# Patient Record
Sex: Female | Born: 1985
Health system: Southern US, Community
[De-identification: ages and names within clinical notes are randomized; demographics above are authoritative.]

## PROBLEM LIST (undated history)

## (undated) ENCOUNTER — Inpatient Hospital Stay (HOSPITAL_COMMUNITY): Payer: Self-pay

## (undated) DIAGNOSIS — B342 Coronavirus infection, unspecified: Secondary | ICD-10-CM

## (undated) DIAGNOSIS — S065XAA Traumatic subdural hemorrhage with loss of consciousness status unknown, initial encounter: Secondary | ICD-10-CM

## (undated) DIAGNOSIS — I82409 Acute embolism and thrombosis of unspecified deep veins of unspecified lower extremity: Secondary | ICD-10-CM

## (undated) DIAGNOSIS — O24419 Gestational diabetes mellitus in pregnancy, unspecified control: Secondary | ICD-10-CM

## (undated) DIAGNOSIS — I10 Essential (primary) hypertension: Secondary | ICD-10-CM

## (undated) DIAGNOSIS — O039 Complete or unspecified spontaneous abortion without complication: Secondary | ICD-10-CM

## (undated) HISTORY — DX: Complete or unspecified spontaneous abortion without complication: O03.9

---
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
## 2004-01-26 ENCOUNTER — Ambulatory Visit: Payer: Self-pay | Admitting: Family Medicine

## 2004-02-22 ENCOUNTER — Ambulatory Visit: Payer: Self-pay | Admitting: Family Medicine

## 2004-02-23 ENCOUNTER — Ambulatory Visit: Payer: Self-pay | Admitting: *Deleted

## 2004-07-19 ENCOUNTER — Emergency Department (HOSPITAL_COMMUNITY): Admission: EM | Admit: 2004-07-19 | Discharge: 2004-07-19 | Payer: Self-pay | Admitting: Family Medicine

## 2004-09-10 ENCOUNTER — Emergency Department (HOSPITAL_COMMUNITY): Admission: EM | Admit: 2004-09-10 | Discharge: 2004-09-10 | Payer: Self-pay | Admitting: Family Medicine

## 2004-12-12 ENCOUNTER — Emergency Department (HOSPITAL_COMMUNITY): Admission: EM | Admit: 2004-12-12 | Discharge: 2004-12-12 | Payer: Self-pay | Admitting: Family Medicine

## 2004-12-23 ENCOUNTER — Emergency Department (HOSPITAL_COMMUNITY): Admission: EM | Admit: 2004-12-23 | Discharge: 2004-12-23 | Payer: Self-pay | Admitting: Family Medicine

## 2005-01-04 ENCOUNTER — Encounter (INDEPENDENT_AMBULATORY_CARE_PROVIDER_SITE_OTHER): Payer: Self-pay | Admitting: *Deleted

## 2005-01-04 LAB — CONVERTED CEMR LAB

## 2005-01-30 ENCOUNTER — Ambulatory Visit: Payer: Self-pay | Admitting: Family Medicine

## 2005-01-30 ENCOUNTER — Encounter (INDEPENDENT_AMBULATORY_CARE_PROVIDER_SITE_OTHER): Payer: Self-pay | Admitting: *Deleted

## 2005-03-12 ENCOUNTER — Ambulatory Visit: Payer: Self-pay | Admitting: Family Medicine

## 2005-04-03 ENCOUNTER — Ambulatory Visit: Payer: Self-pay | Admitting: Sports Medicine

## 2005-09-25 ENCOUNTER — Ambulatory Visit: Payer: Self-pay | Admitting: Family Medicine

## 2006-03-07 ENCOUNTER — Emergency Department (HOSPITAL_COMMUNITY): Admission: EM | Admit: 2006-03-07 | Discharge: 2006-03-07 | Payer: Self-pay | Admitting: Emergency Medicine

## 2006-03-10 ENCOUNTER — Ambulatory Visit: Payer: Self-pay | Admitting: Sports Medicine

## 2006-07-04 ENCOUNTER — Encounter (INDEPENDENT_AMBULATORY_CARE_PROVIDER_SITE_OTHER): Payer: Self-pay | Admitting: *Deleted

## 2006-08-07 ENCOUNTER — Emergency Department (HOSPITAL_COMMUNITY): Admission: EM | Admit: 2006-08-07 | Discharge: 2006-08-07 | Payer: Self-pay | Admitting: Family Medicine

## 2006-08-09 ENCOUNTER — Emergency Department (HOSPITAL_COMMUNITY): Admission: EM | Admit: 2006-08-09 | Discharge: 2006-08-09 | Payer: Self-pay | Admitting: Emergency Medicine

## 2006-08-11 ENCOUNTER — Emergency Department (HOSPITAL_COMMUNITY): Admission: EM | Admit: 2006-08-11 | Discharge: 2006-08-11 | Payer: Self-pay | Admitting: Emergency Medicine

## 2006-11-19 ENCOUNTER — Encounter: Payer: Self-pay | Admitting: *Deleted

## 2006-11-19 ENCOUNTER — Ambulatory Visit: Payer: Self-pay | Admitting: Family Medicine

## 2006-11-19 DIAGNOSIS — R42 Dizziness and giddiness: Secondary | ICD-10-CM

## 2006-12-24 ENCOUNTER — Ambulatory Visit: Payer: Self-pay | Admitting: Family Medicine

## 2007-01-28 ENCOUNTER — Inpatient Hospital Stay (HOSPITAL_COMMUNITY): Admission: AD | Admit: 2007-01-28 | Discharge: 2007-01-28 | Payer: Self-pay | Admitting: Obstetrics & Gynecology

## 2007-02-27 ENCOUNTER — Emergency Department (HOSPITAL_COMMUNITY): Admission: EM | Admit: 2007-02-27 | Discharge: 2007-02-27 | Payer: Self-pay | Admitting: Family Medicine

## 2007-02-27 ENCOUNTER — Inpatient Hospital Stay (HOSPITAL_COMMUNITY): Admission: AD | Admit: 2007-02-27 | Discharge: 2007-02-27 | Payer: Self-pay | Admitting: Obstetrics and Gynecology

## 2007-04-11 ENCOUNTER — Ambulatory Visit: Payer: Self-pay | Admitting: *Deleted

## 2007-04-11 ENCOUNTER — Ambulatory Visit (HOSPITAL_COMMUNITY): Admission: RE | Admit: 2007-04-11 | Discharge: 2007-04-11 | Payer: Self-pay | Admitting: Obstetrics and Gynecology

## 2007-04-11 ENCOUNTER — Encounter (INDEPENDENT_AMBULATORY_CARE_PROVIDER_SITE_OTHER): Payer: Self-pay | Admitting: Obstetrics and Gynecology

## 2007-07-22 ENCOUNTER — Ambulatory Visit: Payer: Self-pay | Admitting: Surgery

## 2007-07-22 ENCOUNTER — Ambulatory Visit (HOSPITAL_COMMUNITY): Admission: RE | Admit: 2007-07-22 | Discharge: 2007-07-22 | Payer: Self-pay | Admitting: Obstetrics and Gynecology

## 2007-07-22 ENCOUNTER — Encounter (INDEPENDENT_AMBULATORY_CARE_PROVIDER_SITE_OTHER): Payer: Self-pay | Admitting: Obstetrics and Gynecology

## 2007-08-07 ENCOUNTER — Inpatient Hospital Stay (HOSPITAL_COMMUNITY): Admission: AD | Admit: 2007-08-07 | Discharge: 2007-08-08 | Payer: Self-pay | Admitting: Obstetrics and Gynecology

## 2007-08-24 ENCOUNTER — Encounter (INDEPENDENT_AMBULATORY_CARE_PROVIDER_SITE_OTHER): Payer: Self-pay | Admitting: Obstetrics and Gynecology

## 2007-08-24 ENCOUNTER — Inpatient Hospital Stay (HOSPITAL_COMMUNITY): Admission: AD | Admit: 2007-08-24 | Discharge: 2007-08-27 | Payer: Self-pay | Admitting: Obstetrics and Gynecology

## 2007-08-24 DIAGNOSIS — O3660X0 Maternal care for excessive fetal growth, unspecified trimester, not applicable or unspecified: Secondary | ICD-10-CM

## 2008-06-27 ENCOUNTER — Encounter: Payer: Self-pay | Admitting: *Deleted

## 2008-06-27 ENCOUNTER — Other Ambulatory Visit: Admission: RE | Admit: 2008-06-27 | Discharge: 2008-06-27 | Payer: Self-pay | Admitting: Family Medicine

## 2008-06-27 ENCOUNTER — Encounter: Payer: Self-pay | Admitting: Family Medicine

## 2008-06-27 ENCOUNTER — Ambulatory Visit: Payer: Self-pay | Admitting: Family Medicine

## 2008-06-27 DIAGNOSIS — N912 Amenorrhea, unspecified: Secondary | ICD-10-CM

## 2008-06-27 LAB — CONVERTED CEMR LAB
Beta hcg, urine, semiquantitative: POSITIVE
Whiff Test: POSITIVE

## 2008-06-29 LAB — CONVERTED CEMR LAB
Chlamydia, DNA Probe: POSITIVE — AB
GC Probe Amp, Genital: NEGATIVE

## 2008-06-30 ENCOUNTER — Ambulatory Visit: Payer: Self-pay | Admitting: Family Medicine

## 2008-06-30 DIAGNOSIS — A5609 Other chlamydial infection of lower genitourinary tract: Secondary | ICD-10-CM | POA: Insufficient documentation

## 2008-06-30 LAB — CONVERTED CEMR LAB: Pap Smear: NORMAL

## 2008-09-26 ENCOUNTER — Emergency Department (HOSPITAL_COMMUNITY): Admission: EM | Admit: 2008-09-26 | Discharge: 2008-09-26 | Payer: Self-pay | Admitting: Family Medicine

## 2009-05-02 ENCOUNTER — Ambulatory Visit (HOSPITAL_COMMUNITY): Admission: RE | Admit: 2009-05-02 | Discharge: 2009-05-02 | Payer: Self-pay | Admitting: Emergency Medicine

## 2009-05-02 ENCOUNTER — Emergency Department (HOSPITAL_COMMUNITY): Admission: EM | Admit: 2009-05-02 | Discharge: 2009-05-02 | Payer: Self-pay | Admitting: Emergency Medicine

## 2009-05-02 ENCOUNTER — Encounter (INDEPENDENT_AMBULATORY_CARE_PROVIDER_SITE_OTHER): Payer: Self-pay | Admitting: Emergency Medicine

## 2009-05-02 ENCOUNTER — Ambulatory Visit: Payer: Self-pay | Admitting: Vascular Surgery

## 2010-03-14 ENCOUNTER — Emergency Department (HOSPITAL_COMMUNITY): Admission: EM | Admit: 2010-03-14 | Discharge: 2010-03-14 | Payer: Self-pay | Admitting: Family Medicine

## 2010-09-07 ENCOUNTER — Encounter: Payer: Self-pay | Admitting: Family Medicine

## 2010-09-07 ENCOUNTER — Ambulatory Visit (INDEPENDENT_AMBULATORY_CARE_PROVIDER_SITE_OTHER): Payer: Self-pay | Admitting: Family Medicine

## 2010-09-07 DIAGNOSIS — L708 Other acne: Secondary | ICD-10-CM

## 2010-09-07 DIAGNOSIS — J309 Allergic rhinitis, unspecified: Secondary | ICD-10-CM

## 2010-09-07 DIAGNOSIS — N920 Excessive and frequent menstruation with regular cycle: Secondary | ICD-10-CM

## 2010-09-07 DIAGNOSIS — L709 Acne, unspecified: Secondary | ICD-10-CM

## 2010-09-07 MED ORDER — FEXOFENADINE HCL 180 MG PO TABS: 180.0000 mg | ORAL_TABLET | Freq: Every day | ORAL | Status: DC

## 2010-09-07 MED ORDER — NORETHINDRONE-ETH ESTRADIOL 1-35 MG-MCG PO TABS: 1.0000 | ORAL_TABLET | Freq: Every day | ORAL | Status: DC

## 2010-09-07 NOTE — Progress Notes (Signed)
  Subjective:    Patient ID: Ashley Rush, female    DOB: [birth year], 25 y.o.   MRN: [id number]  HPI Comments: Has two month h/o heavy vaginal bleeding and cramping.  Prior to this cycles were WNL.  Cycles are very heavy x 2-3 days and last for 7 days. Also, c/o acne.  Sinusitis This is a recurrent problem. The current episode started more than 1 month ago. The problem has been gradually worsening since onset. There has been no fever. Associated symptoms include congestion and sneezing. Pertinent negatives include no shortness of breath. Treatments tried: antihistamine. The treatment provided significant (makes her drowsy) relief.      Review of Systems  HENT: Positive for congestion and sneezing.   Respiratory: Negative for shortness of breath.   Gastrointestinal: Negative for abdominal pain.  Genitourinary: Positive for vaginal bleeding.  Musculoskeletal: Negative for arthralgias.       Objective:   Physical Exam  Constitutional: She appears well-developed and well-nourished.  HENT:  Head: Normocephalic and atraumatic.  Right Ear: Tympanic membrane normal.  Left Ear: Tympanic membrane normal.  Nose: Mucosal edema present. Right sinus exhibits no frontal sinus tenderness. Left sinus exhibits no frontal sinus tenderness.  Mouth/Throat: Uvula is midline, oropharynx is clear and moist and mucous membranes are normal.  Cardiovascular: Normal rate and regular rhythm.   Pulmonary/Chest: Effort normal and breath sounds normal.          Assessment & Plan:

## 2010-09-07 NOTE — Patient Instructions (Signed)
Allergic Rhinitis Allergic rhinitis is when the mucous membranes in the nose respond to allergens. Allergens are particles in the air that cause your body to have an allergic reaction. This causes you to release allergic antibodies. Through a chain of events, these eventually cause you to release histamine into the blood stream (hence the use of antihistamines). Although meant to be protective to the body, it is this release that causes your discomfort, such as frequent sneezing, congestion and an itchy runny nose.  CAUSES The pollen allergens may come from grasses, trees, and weeds. This is seasonal allergic rhinitis, or "hay fever." Other allergens cause year-round allergic rhinitis (perennial allergic rhinitis) such as house dust mite allergen, pet dander and mold spores.  SYMPTOMS  Nasal stuffiness (congestion).   Runny, itchy nose with sneezing and tearing of the eyes.   There is often an itching of the mouth, eyes and ears.  It cannot be cured, but it can be controlled with medications. DIAGNOSIS If you are unable to determine the offending allergen, skin or blood testing may find it. TREATMENT  Avoid the allergen.   Medications and allergy shots (immunotherapy) can help.   Hay fever may often be treated with antihistamines in pill or nasal spray forms. Antihistamines block the effects of histamine. There are over-the-counter medicines that may help with nasal congestion and swelling around the eyes. Check with your caregiver before taking or giving this medicine.  If the treatment above does not work, there are many new medications your caregiver can prescribe. Stronger medications may be used if initial measures are ineffective. Desensitizing injections can be used if medications and avoidance fails. Desensitization is when a patient is given ongoing shots until the body becomes less sensitive to the allergen. Make sure you follow up with your caregiver if problems continue. SEEK  MEDICAL CARE IF:   You develop fever (more than 100.20F (38.1 C).   You develop a cough that does not stop easily (persistent).   You have shortness of breath.   You start wheezing.   Symptoms interfere with normal daily activities.  Document Released: 01/15/2001 Document Re-Released: 05/14/2009 [facility] Patient Information 2011 [place], [place].Menorrhagia, Heavy Periods Dysfunctional uterine bleeding is different from a normal menstrual period. When periods are heavy or there is more bleeding than is usual for you, it is called menorrhagia. It may be caused by hormonal imbalance, or physical, metabolic, or other problems. Examination is necessary in order that your caregiver may treat treatable causes. If this is a continuing problem, a D&C may be needed. That means that the cervix (the opening of the uterus or womb) is dilated (stretched larger) and the lining of the uterus is scraped out. The tissue scraped out is then examined under a microscope by a specialist (pathologist) to make sure there is nothing of concern that needs further or more extensive treatment. HOME CARE INSTRUCTIONS  If medications were prescribed, take exactly as directed. Do not change or switch medications without consulting your caregiver.   Long term heavy bleeding may result in iron deficiency. Your caregiver may have prescribed iron pills. They help replace the iron your body lost from heavy bleeding. Take exactly as directed. Iron may cause constipation. If this becomes a problem, increase the bran, fruits, and roughage in your diet.   Do not take aspirin or medicines that contain aspirin one week before or during your menstrual period. Aspirin may make the bleeding worse.   If you need to change your sanitary pad or  tampon more than once every 2 hours, stay in bed and rest as much as possible until the bleeding stops.   Eat well-balanced meals. Eat foods high in iron. Examples are leafy green vegetables, meat,  liver, eggs, and whole grain breads and cereals. Do not try to lose weight until the abnormal bleeding has stopped and your blood iron level is back to normal.  SEEK MEDICAL CARE IF:  You need to change your sanitary pad or tampon more than once an hour.   You develop nausea (feeling sick to your stomach) and vomiting, dizziness, or diarrhea while you are taking your medicine.   You have any problems that may be related to the medicine you are taking.  SEEK IMMEDIATE MEDICAL CARE IF:  An oral temperature above 101 develops.   You develop chills.   You develop severe bleeding or start to pass blood clots.   You feel dizzy or faint.  MAKE SURE YOU:   Understand these instructions.   Will watch your condition.   Will get help right away if you are not doing well or get worse.  Document Released: 04/22/2005 Document Re-Released: 04/04/2008 [facility] Patient Information 2011 [place], [place].

## 2010-09-10 ENCOUNTER — Encounter: Payer: Self-pay | Admitting: *Deleted

## 2010-09-10 ENCOUNTER — Other Ambulatory Visit: Payer: Self-pay | Admitting: Family Medicine

## 2010-09-10 DIAGNOSIS — N920 Excessive and frequent menstruation with regular cycle: Secondary | ICD-10-CM

## 2010-09-17 ENCOUNTER — Inpatient Hospital Stay (HOSPITAL_COMMUNITY): Admission: RE | Admit: 2010-09-17 | Payer: 59 | Source: Ambulatory Visit

## 2010-09-17 ENCOUNTER — Other Ambulatory Visit (HOSPITAL_COMMUNITY): Payer: 59

## 2010-09-18 NOTE — Discharge Summary (Signed)
NAMEPILAR, WESTERGAARD           ACCOUNT NO.:  [id number]   MEDICAL RECORD NO.:  [id number]          PATIENT TYPE:  INP   LOCATION:  9130                          FACILITY:  [facility]   PHYSICIAN:  Zenaida Niece, M.D.DATE OF BIRTH:  1986/04/22   DATE OF ADMISSION:  08/24/2007  DATE OF DISCHARGE:                               DISCHARGE SUMMARY   ADMISSION DIAGNOSES:  Intrauterine pregnancy at 38 weeks and history of  cellulitis.   DISCHARGE DIAGNOSES:  Intrauterine pregnancy at 38 weeks, history of  cellulitis, and arrest of descent and fetal macrosomia.   PROCEDURES:  On August 24, 2007, she had a primary low transverse  cesarean section.   HISTORY OF PRESENT ILLNESS:  This is a 25 year old black female gravida  1, para 0 with an EGA of 38+ weeks who presents with complaint of  spontaneous rupture of membranes at 0600 on the day of admission  followed by contractions.  Evaluation in maternal admissions confirmed  ruptured membranes and the cervix was 3 cm dilated.  Prenatal care  complicated by UTIs with E. coli treated with Macrobid, sickle cell  trait and a history of right leg cellulitis with edema for which she had  normal right lower extremity Dopplers to rule out DVT.  She was slightly  greater than dates with appropriate for gestational age and slightly  elevated amniotic fluid by ultrasound.  Prenatal labs blood type is O  positive with negative antibody screen, immune electrophoresis with  hemoglobin AS.  RPR nonreactive, rubella immune, hepatitis B surface  antigen negative, HIV negative, gonorrhea and chlamydia negative, cystic  fibrosis negative.  Quad screen normal, 1-hour Glucola 150, 3-hour GTT  82, 208, 128, and 127 and group B strep is negative.   PAST MEDICAL HISTORY:  Significant for cellulitis and possible  thrombophlebitis right leg and hemoglobin AS.   PHYSICAL EXAMINATION:  VITAL SIGNS:  She is afebrile with stable vital  signs.  Fetal heart tracing  reactive with contractions every 2-3  minutes.  ABDOMEN:  Gravid, nontender with an estimated fetal weight of 8 pounds.  Cervix on my first exam she is 8, 90 and 0 with a vertex presentation.   HOSPITAL COURSE:  The patient is admitted with ruptured membranes and an  early labor, contracting on her own.  She continued to contract and  received an epidural.  She progressed to complete and pushed for 2+  hours and was unable to bring the vertex past A+1 station.  She did  develop a temperature to 100.6 about an hour before delivery.  On the  evening of August 24, 2007, she was taken to the operating room and had a  primary cesarean section done under epidural anesthesia for arrest of  descent.  She had normal anatomy and delivered a viable female infant  with Apgars of 9 and 9, weight 9 pounds 11 ounces.  Estimated blood loss  was 800 mL.  Postoperatively, she had no significant complications.  Predelivery hemoglobin 11.4, postdelivery 9.5.  On postoperative day #3,  she had remained afebrile.  Incision was healing well.  She was felt to  be stable enough for discharge home.   DISCHARGE INSTRUCTIONS:  Regular diet, pelvic rest, and no strenuous  activity.  Followup within 2 weeks for an incision check.   DISCHARGE MEDICATIONS:  Percocet #30 1-2 p.o. q.4-6 h. p.r.n. pain and  over-the-counter ibuprofen as needed and she is given our discharge  pamphlet.  Prior to discharge her staples were to be removed and Steri-  Strips applied.      Zenaida Niece, M.D.  Electronically Signed     TDM/MEDQ  D:  08/27/2007  T:  08/27/2007  Job:  [id number]

## 2010-09-18 NOTE — Op Note (Signed)
Ashley Rush, Ashley Rush           ACCOUNT NO.:  [id number]   MEDICAL RECORD NO.:  [id number]          PATIENT TYPE:  INP   LOCATION:  9130                          FACILITY:  [facility]   PHYSICIAN:  Leighton Roach Meisinger, M.D.DATE OF BIRTH:  Sep 27, 1985   DATE OF PROCEDURE:  08/24/2007  DATE OF DISCHARGE:                               OPERATIVE REPORT   PREOPERATIVE DIAGNOSIS:  Intrauterine pregnancy at 38+ weeks, arrest of  descent.   POSTOPERATIVE DIAGNOSES:  1. Intrauterine pregnancy at 38+ weeks, arrest of descent.  2. Fetal macrosomia.   PROCEDURE:  Primary low transverse Cesarean section without extension.   SURGEON:  Zenaida Niece, M.D.   ANESTHESIA:  Epidural.   ESTIMATED BLOOD LOSS:  800 mL.   FINDINGS:  The patient had normal gravid anatomy and delivered a viable  female infant with Apgar of 9 and 9 that weighed 9 pounds and 11 ounces.   SPECIMENS:  Placenta sent for cord blood collection and pathology.   COMPLICATIONS:  None.   PROCEDURE IN DETAIL:  The patient was taken to the operating room and  placed in the dorsal supine position with a left, lateral tilt.  Her  previously placed epidural was dosed appropriately.  Abdomen was prepped  and draped in the usual sterile fashion.  A Foley catheter had  previously been placed.  The level of her anesthesia was found to be  adequate and abdomen was entered via a standard Pfannenstiel incision.  The bladder was fairly large and prolapsing through the incision.  The  Foley catheter was inspected and found to be draining a small amount of  urine.  I concluded that the urethra was being occluded by the fetal  head.  The Alexis disposable self-retaining retractor was placed and  this moved the bladder out of the way.   A 4 cm transverse incision was then made in the lower uterine segment.  Once the uterine cavity was entered, the incision was extended  bilaterally digitally.  The fetal vertex was fairly low in the pelvis  and was grasped and elevated and delivered through the incision  atraumatically.  Mouth and nares were suctioned.  The remainder of the  infant then delivered atraumatically.  The cord was doubly clamped and  cut and the infant handed to the waiting pediatric team.  Placenta  delivered spontaneously and was sent for cord blood collection and then  to pathology.  Uterine incision was inspected and found to be free of  extensions.  The uterus was wiped dry with a clean lap pad and all clots  and debris were removed.  The bladder was now draining fairly well and  was well below the incision.  The uterine incision was closed in 2  layers with the first layer being a running, locking layer with #1  Chromic, and the second layer being an imbricating layer also with #1  Chromic.  Good hemostasis was achieved.  Tubes and ovaries were  inspected and found to be normal.  The uterine incision was again  inspected and found to be hemostatic.  Subfascial space was then  irrigated and  made hemostatic with electrocautery.  Due to her bladder  still prolapsing through the muscles, the rectus muscles were  reapproximated in the midline with running 3-0 Vicryl.  Fascia was then  closed in a running fashion starting at both ends and meeting in the  middle with 0 Vicryl.  Subcutaneous tissue was then irrigated and made  hemostatic with electrocautery.  Skin was closed with staples followed  by a sterile dressing.  The patient tolerated the procedure well and was  taken to the recovery room in stable condition.  Counts were correct x2  and she was given Ancef 1 g IV at the beginning of the procedure.      Zenaida Niece, M.D.  Electronically Signed     TDM/MEDQ  D:  08/24/2007  T:  08/24/2007  Job:  [id number]

## 2010-09-21 ENCOUNTER — Ambulatory Visit: Payer: 59 | Admitting: Family Medicine

## 2010-10-04 ENCOUNTER — Ambulatory Visit: Payer: 59 | Admitting: Family Medicine

## 2010-11-21 ENCOUNTER — Encounter: Payer: 59 | Admitting: Family Medicine

## 2010-12-11 ENCOUNTER — Ambulatory Visit (INDEPENDENT_AMBULATORY_CARE_PROVIDER_SITE_OTHER): Payer: 59 | Admitting: Family Medicine

## 2010-12-11 ENCOUNTER — Encounter: Payer: Self-pay | Admitting: Family Medicine

## 2010-12-11 ENCOUNTER — Other Ambulatory Visit (HOSPITAL_COMMUNITY)
Admission: RE | Admit: 2010-12-11 | Discharge: 2010-12-11 | Disposition: A | Discharge status: Home / Self Care | Payer: 59 | Source: Ambulatory Visit | Attending: Family Medicine | Admitting: Family Medicine

## 2010-12-11 VITALS — BP 122/78 | HR 81 | Ht 67.0 in | Wt 141.0 lb

## 2010-12-11 DIAGNOSIS — Z13220 Encounter for screening for lipoid disorders: Secondary | ICD-10-CM

## 2010-12-11 DIAGNOSIS — Z131 Encounter for screening for diabetes mellitus: Secondary | ICD-10-CM | POA: Insufficient documentation

## 2010-12-11 DIAGNOSIS — Z01419 Encounter for gynecological examination (general) (routine) without abnormal findings: Secondary | ICD-10-CM | POA: Insufficient documentation

## 2010-12-11 DIAGNOSIS — Z113 Encounter for screening for infections with a predominantly sexual mode of transmission: Secondary | ICD-10-CM | POA: Insufficient documentation

## 2010-12-11 DIAGNOSIS — Z124 Encounter for screening for malignant neoplasm of cervix: Secondary | ICD-10-CM

## 2010-12-11 DIAGNOSIS — N760 Acute vaginitis: Secondary | ICD-10-CM

## 2010-12-11 LAB — POCT WET PREP (WET MOUNT)
Clue Cells Wet Prep HPF POC: NEGATIVE
Trichomonas Wet Prep HPF POC: NEGATIVE
WBC, Wet Prep HPF POC: >20

## 2010-12-11 MED ORDER — FLUCONAZOLE 100 MG PO TABS: 150.0000 mg | ORAL_TABLET | Freq: Once | ORAL | Status: DC

## 2010-12-11 NOTE — Patient Instructions (Addendum)
Ms. Droz,  Thank you for coming in to see me today. I will call you with the results of studies. Please call and come back to the clinic for a lab visit to get your blood drawn within the next week.   -Dr. Armen Pickup

## 2010-12-11 NOTE — Progress Notes (Signed)
  Subjective:    Patient ID: Ashley Rush, female    DOB: 09-22-1985, 25 y.o.   MRN: [id number]  HPI SUBJECTIVE:  25 y.o. female for annual routine Pap and checkup. Reviewed FamHx: pt mom and dad deceased. Died of complications of HIV. Both had DM and HTN. Pt have never been screening for DM or HLD. She denies symptoms of hyperglycemia including polyuria, polydipsia, weight loss or gain, CP, LE edema.  Pt  Reviewed Social HX: pt denies ETOH, tobacco, IDU. Sexually active with one female partner x 2 years. Pt feels safe in her relationship.  Current Outpatient Prescriptions  Medication Sig Dispense Refill  . norethindrone-ethinyl estradiol (ORTHO-NOVUM 1/35, 28,) 1-35 MG-MCG per tablet Take 1 tablet by mouth daily.  28 tablet  11  . fexofenadine (ALLEGRA) 180 MG tablet Take 1 tablet (180 mg total) by mouth daily.  30 tablet  2  . fluconazole (DIFLUCAN) 100 MG tablet Take 1.5 tablets (150 mg total) by mouth once.  1 tablet  0   Allergies: Review of patient's allergies indicates no known allergies.  Patient's last menstrual period was 11/20/2010.  ROS:  Feeling well. No dyspnea or chest pain on exertion.  No abdominal pain, change in bowel habits, black or bloody stools.  No urinary tract symptoms. GYN ROS: normal menses, no abnormal bleeding, pelvic pain or discharge, no breast pain or new or enlarging lumps on self exam, no side effects of hormonal medications, she complains of small amount of white vaginal discharge. No neurological complaints.  OBJECTIVE:  The patient appears well, alert, oriented x 3, in no distress. BP 122/78  Pulse 81  Ht 5\' 7"  (1.702 m)  Wt 141 lb (63.957 kg)  BMI 22.08 kg/m2  LMP 11/20/2010 ENT normal.  Neck supple. No adenopathy or thyromegaly. PERLA. Lungs are clear, good air entry, no wheezes, rhonchi or rales. S1 and S2 normal, no murmurs, regular rate and rhythm. Abdomen soft without tenderness, guarding, mass or organomegaly. Extremities show RLE > LLE   With trace edema of RLE (per pt this is chronic since age 73), normal peripheral pulses. Neurological is normal, no focal findings.  BREAST EXAM:risk and benefit of breast self-exam was discussed, not examined  PELVIC EXAM: normal external genitalia, vulva, vagina, cervix, uterus and adnexa, PAP: Pap smear done today, WET MOUNT done - results: hyphae, exam chaperoned by Kenney Houseman nurse.  ASSESSMENT:  well woman no contraindication to continue hormonal therapy  PLAN:  pap smear additional lab tests per orders return annually or prn    Review of Systems     Objective:   Physical Exam        Assessment & Plan:

## 2010-12-12 LAB — GC/CHLAMYDIA PROBE AMP, GENITAL
Chlamydia, DNA Probe: NEGATIVE
GC Probe Amp, Genital: NEGATIVE

## 2010-12-12 NOTE — Assessment & Plan Note (Signed)
Pt to return for blood work when fasting. Will obtain FLP.

## 2010-12-12 NOTE — Assessment & Plan Note (Signed)
Screening asymptomatic low risk female.

## 2010-12-12 NOTE — Assessment & Plan Note (Signed)
Pt to return for blood work which will include screening POCT A1c.

## 2010-12-14 ENCOUNTER — Other Ambulatory Visit: Payer: 59

## 2010-12-14 DIAGNOSIS — Z13220 Encounter for screening for lipoid disorders: Secondary | ICD-10-CM

## 2010-12-14 DIAGNOSIS — Z113 Encounter for screening for infections with a predominantly sexual mode of transmission: Secondary | ICD-10-CM

## 2010-12-14 LAB — LIPID PANEL
Cholesterol: 125 mg/dL (ref 0–200)
HDL: 47 mg/dL (ref >39)
LDL Cholesterol: 66 mg/dL (ref 0–99)
Total CHOL/HDL Ratio: 2.7 Ratio
Triglycerides: 62 mg/dL (ref <150)
VLDL: 12 mg/dL (ref 0–40)

## 2010-12-14 LAB — RPR

## 2010-12-14 MED ORDER — FLUCONAZOLE 150 MG PO TABS: 150.0000 mg | ORAL_TABLET | Freq: Once | ORAL | Status: AC

## 2010-12-14 NOTE — Progress Notes (Signed)
Addended by: Dessa Phi on: 12/14/2010 09:17 AM   Modules accepted: Orders

## 2010-12-14 NOTE — Progress Notes (Signed)
Rpr,hiv and flp done today [facility] Shevette Bess

## 2010-12-15 LAB — HIV ANTIBODY (ROUTINE TESTING W REFLEX): HIV: NONREACTIVE

## 2010-12-17 ENCOUNTER — Telehealth: Payer: Self-pay | Admitting: Family Medicine

## 2010-12-17 NOTE — Telephone Encounter (Deleted)
Message copied by Dessa Phi on Mon Dec 17, 2010  9:39 AM ------      Message from: MCDIARMID, TODD D      Created: Mon Dec 17, 2010  8:34 AM                   ----- Message -----         From: [facility] [place] Interface         Sent: 12/14/2010   7:59 PM           To: Leighton Roach McDiarmid

## 2010-12-17 NOTE — Telephone Encounter (Signed)
Phoned pt to tell her that HIV and RPR were negative.

## 2010-12-21 ENCOUNTER — Ambulatory Visit (INDEPENDENT_AMBULATORY_CARE_PROVIDER_SITE_OTHER): Payer: 59 | Admitting: Family Medicine

## 2010-12-21 VITALS — BP 121/76 | HR 85 | Temp 97.8°F | Wt 142.9 lb

## 2010-12-21 DIAGNOSIS — N898 Other specified noninflammatory disorders of vagina: Secondary | ICD-10-CM

## 2010-12-21 DIAGNOSIS — N920 Excessive and frequent menstruation with regular cycle: Secondary | ICD-10-CM

## 2010-12-21 DIAGNOSIS — N921 Excessive and frequent menstruation with irregular cycle: Secondary | ICD-10-CM

## 2010-12-21 DIAGNOSIS — N923 Ovulation bleeding: Secondary | ICD-10-CM

## 2010-12-21 LAB — POCT URINE PREGNANCY: Preg Test, Ur: NEGATIVE

## 2010-12-21 NOTE — Assessment & Plan Note (Signed)
On OCPs taking as prescribed.  Not pregnant today.  Pt willing to keep trying the OCP for another 3 months.  No concern for STDs at this time.  At that time, if still having spotting, may want to change contraceptive method.  Discussed ring, patch, implanon, and IUD.

## 2010-12-21 NOTE — Progress Notes (Signed)
  Subjective:    Patient ID: Ashley Rush, female    DOB: 03-15-1986, 25 y.o.   MRN: [id number]  HPI  Started OCPs on the SUnday in may during her period.  She has not missed any pills.  She had cramping and spotting before her no hormone pills last month and this month.  She is also concerned because her periods are not on their usual schedule since starting her OCPs.  She is not concerned for STDs as she was recently tested and has no new exposures.  She denies d/c odor or itch.    Review of Systems Denies CP, SOB, HA, N/V/D, fever     Objective:   Physical Exam Vital signs reviewed General appearance - alert, well appearing, and in no distress and oriented to person, place, and time        Assessment & Plan:  Spotting between menses On OCPs taking as prescribed.  Not pregnant today.  Pt willing to keep trying the OCP for another 3 months.  No concern for STDs at this time.  At that time, if still having spotting, may want to change contraceptive method.  Discussed ring, patch, implanon, and IUD.

## 2011-01-15 ENCOUNTER — Encounter: Payer: Self-pay | Admitting: Family Medicine

## 2011-01-15 ENCOUNTER — Ambulatory Visit (INDEPENDENT_AMBULATORY_CARE_PROVIDER_SITE_OTHER): Payer: 59 | Admitting: Family Medicine

## 2011-01-15 DIAGNOSIS — J309 Allergic rhinitis, unspecified: Secondary | ICD-10-CM

## 2011-01-15 DIAGNOSIS — H9201 Otalgia, right ear: Secondary | ICD-10-CM | POA: Insufficient documentation

## 2011-01-15 DIAGNOSIS — J302 Other seasonal allergic rhinitis: Secondary | ICD-10-CM

## 2011-01-15 MED ORDER — FEXOFENADINE HCL 180 MG PO TABS: 180.0000 mg | ORAL_TABLET | Freq: Every day | ORAL | Status: DC

## 2011-01-15 MED ORDER — MOMETASONE FUROATE 50 MCG/ACT NA SUSP: 2.0000 | Freq: Every day | NASAL | Status: DC

## 2011-01-15 NOTE — Assessment & Plan Note (Signed)
Symptoms consistent with allergies. Start steroid nasal spray and OTC anti-histamine. Given red flags of infection. Follow-up prn.

## 2011-01-15 NOTE — Patient Instructions (Signed)
I think you have allergies. Try the Allegra and the nasal spray.  Stay well-hydrated.  If you develop fevers, sore throat, cough with sputum, you may have an infection, so please return to the clinic.

## 2011-01-15 NOTE — Progress Notes (Signed)
  Subjective:    Patient ID: Ashley Rush, female    DOB: October 29, 1985, 25 y.o.   MRN: [id number]  HPI Past several days complaining of fullness in her right ear, nasal discharge, and "lots of mucous" in throat. Took Sudafed and Nyquil but doesn't seem to be helping. Denies fevers, cough, sore throat, difficulty breathing.   Review of Systems     Objective:   Physical Exam Gen: NAD HEENT: TM clear with no erythema or air/fluid level. No pain with tragal pressure. No conjunctival erythema or tearing. Nares with clear discharge. Throat is clear with no tonsillar adenopathy or oropharyngeal lesions. No cervical LAD. Pulm: CTAB, no w/r/r Skin: warm, dry, no rashes    Assessment & Plan:

## 2011-01-29 LAB — URINALYSIS, ROUTINE W REFLEX MICROSCOPIC
Bilirubin Urine: NEGATIVE
Bilirubin Urine: NEGATIVE
Glucose, UA: NEGATIVE
Glucose, UA: NEGATIVE
Ketones, ur: NEGATIVE
Ketones, ur: NEGATIVE
Nitrite: NEGATIVE
Nitrite: NEGATIVE
Protein, ur: 100 — AB
Protein, ur: >300 — AB
Specific Gravity, Urine: 1.015
Specific Gravity, Urine: 1.02
Urobilinogen, UA: 0.2
Urobilinogen, UA: 0.2
pH: 7.5
pH: 7.5

## 2011-01-29 LAB — URINE MICROSCOPIC-ADD ON

## 2011-01-29 LAB — COMPREHENSIVE METABOLIC PANEL
ALT: 24
AST: 27
Albumin: 2.7 — ABNORMAL LOW
Alkaline Phosphatase: 133 — ABNORMAL HIGH
BUN: 2 — ABNORMAL LOW
CO2: 22
Calcium: 9.4
Chloride: 104
Creatinine, Ser: 0.76
GFR calc Af Amer: >60
GFR calc non Af Amer: >60
Glucose, Bld: 99
Potassium: 4.2
Sodium: 134 — ABNORMAL LOW
Total Bilirubin: 0.7
Total Protein: 6.5

## 2011-01-29 LAB — CBC
HCT: 37.7
Hemoglobin: 12.7
Hemoglobin: 9.5 — ABNORMAL LOW
MCHC: 33.6
MCHC: 33.6
MCV: 85.8
Platelets: 245
RBC: 4.4
RBC: 4.01
RDW: 15.5
RDW: 16.9 — ABNORMAL HIGH
WBC: 20.6 — ABNORMAL HIGH
WBC: 13.9 — ABNORMAL HIGH

## 2011-01-29 LAB — LACTATE DEHYDROGENASE: LDH: 130

## 2011-01-29 LAB — CCBB MATERNAL DONOR DRAW

## 2011-01-29 LAB — RPR: RPR Ser Ql: NONREACTIVE

## 2011-02-13 LAB — URINALYSIS, ROUTINE W REFLEX MICROSCOPIC
Hgb urine dipstick: NEGATIVE
Nitrite: NEGATIVE
Specific Gravity, Urine: 1.015
Urobilinogen, UA: 0.2

## 2011-02-13 LAB — HEMOGLOBIN AND HEMATOCRIT, BLOOD: Hemoglobin: 12.9

## 2011-02-13 LAB — URINE MICROSCOPIC-ADD ON

## 2011-02-14 LAB — WET PREP, GENITAL: Clue Cells Wet Prep HPF POC: NONE SEEN

## 2011-02-14 LAB — URINALYSIS, ROUTINE W REFLEX MICROSCOPIC
Bilirubin Urine: NEGATIVE
Glucose, UA: NEGATIVE
Specific Gravity, Urine: 1.01
pH: 8

## 2011-02-14 LAB — ABO/RH: ABO/RH(D): O POS

## 2011-02-14 LAB — URINE MICROSCOPIC-ADD ON

## 2011-02-14 LAB — POCT PREGNANCY, URINE: Preg Test, Ur: POSITIVE

## 2011-02-14 LAB — CBC
HCT: 40.1
Hemoglobin: 13.7
Platelets: 268
WBC: 9.4

## 2011-03-04 ENCOUNTER — Encounter: Payer: Self-pay | Admitting: Family

## 2011-03-04 ENCOUNTER — Other Ambulatory Visit (HOSPITAL_COMMUNITY)
Admission: RE | Admit: 2011-03-04 | Discharge: 2011-03-04 | Disposition: A | Discharge status: Home / Self Care | Payer: 59 | Source: Ambulatory Visit | Attending: Family | Admitting: Family

## 2011-03-04 ENCOUNTER — Ambulatory Visit (INDEPENDENT_AMBULATORY_CARE_PROVIDER_SITE_OTHER): Payer: 59 | Admitting: Family

## 2011-03-04 ENCOUNTER — Telehealth: Payer: Self-pay | Admitting: *Deleted

## 2011-03-04 DIAGNOSIS — Z0000 Encounter for general adult medical examination without abnormal findings: Secondary | ICD-10-CM

## 2011-03-04 DIAGNOSIS — Z01419 Encounter for gynecological examination (general) (routine) without abnormal findings: Secondary | ICD-10-CM | POA: Insufficient documentation

## 2011-03-04 DIAGNOSIS — N898 Other specified noninflammatory disorders of vagina: Secondary | ICD-10-CM

## 2011-03-04 NOTE — Telephone Encounter (Signed)
Message copied by Kathi Simpers on Mon Mar 04, 2011  2:47 PM ------      Message from: O'SULLIVAN, MELISSA      Created: Mon Mar 04, 2011  2:38 PM       Pls send the following labs orders to lab            CBC, BMET, LFT, TSH, HIV, FLP (v70)

## 2011-03-04 NOTE — Patient Instructions (Signed)
Please return to the lab fasting one day this week.  Welcome to [facility]! Follow up in 1 month, sooner if problems or concerns.

## 2011-03-04 NOTE — Telephone Encounter (Signed)
Lab orders entered and forwarded to the lab. 

## 2011-03-04 NOTE — Progress Notes (Signed)
Subjective:    Patient ID: Ashley Rush, female    DOB: 06/29/1985, 25 y.o.   MRN: [id number]  HPI  Pt new to establish care.  She would like to address her concern about vaginal discharge as well as have a physical today.  1) Vaginal discharge- symptoms started on Thursday. White,  Fishy odor, thick.  No new partners- 1 partner in last 6 mos, no condoms.  Last pap was 9/11.  She would like a pap smear today.  Denies hx of STD.  2) Preventative- She is up to date on flu shot. Last tetanus was 08/10/10. She does not exercise regularly. She has a 26 year old daughter.     Review of Systems  Constitutional: Negative for unexpected weight change.  Genitourinary: Positive for vaginal discharge and menstrual problem.       Patient reports that she was unable to tolerate the birth control pill due to spotting.  + vaginal discharge, see HPI   No past medical history on file.  History   Social History  . Marital Status: Single    Spouse Name: N/A    Number of Children: 1  . Years of Education: N/A   Occupational History  .  [place]    works in [employer]   Social History Main Topics  . Smoking status: Never Smoker   . Smokeless tobacco: Never Used  . Alcohol Use: No  . Drug Use: No  . Sexually Active: Not on file   Other Topics Concern  . Not on file   Social History Narrative   Caffeine use: 2 sodas weeklyRegular exercise:  NoLives with her daughter who is 66 years old.  SaniyhaWorks at [employer] in [employer]. Completed 12 grade.Single.Never smoked. Denies drugs or alcohol.    Past Surgical History  Procedure Date  . Cesarean section 08/24/07    Family History  Problem Relation Age of Onset  . Diabetes Mother   . Heart disease Mother   . Immunodeficiency Mother     HIV- deceased at age 93  . Diabetes Father   . Heart disease Father   . Immunodeficiency Father     HIV- died last year at 62    No Known Allergies  No current outpatient prescriptions on  file prior to visit.    BP 114/66  Pulse 84  Temp(Src) 98.6 F (37 C) (Oral)  Resp 16  Ht 5\' 8"  (1.727 m)  Wt 142 lb 1.3 oz (64.447 kg)  BMI 21.60 kg/m2  LMP 02/16/2011       Objective:   Physical Exam  Constitutional: She is oriented to person, place, and time. She appears well-developed and well-nourished. No distress.  HENT:  Head: Normocephalic and atraumatic.  Right Ear: Tympanic membrane and ear canal normal.  Left Ear: Tympanic membrane and ear canal normal.  Mouth/Throat: Uvula is midline, oropharynx is clear and moist and mucous membranes are normal.  Eyes: Conjunctivae are normal. Pupils are equal, round, and reactive to light. No scleral icterus.  Neck: Normal range of motion. Neck supple. No thyromegaly present.  Cardiovascular: Normal rate and regular rhythm.   No murmur heard. Pulmonary/Chest: Effort normal and breath sounds normal. No respiratory distress. She has no wheezes. She has no rales. She exhibits no tenderness.  Genitourinary:       Breasts: Examined lying.  Right: Without masses, retractions, discharge or axillary adenopathy.  Left: Without masses, retractions, discharge or axillary adenopathy.  Inguinal/mons: Normal without inguinal adenopathy  External genitalia: Normal  BUS/Urethra/Skene's glands: Normal  Bladder: Normal  Vagina: copious thin white discharge Cervix: Normal  Uterus: normal in size, shape and contour. Midline and mobile  Adnexa/parametria:  Rt: Without masses or tenderness.  Lt: Without masses or tenderness.  Anus and perineum: Normal Mervin Kung CMA chaperone   Musculoskeletal: She exhibits no edema and no tenderness.  Lymphadenopathy:    She has no cervical adenopathy.  Neurological: She is alert and oriented to person, place, and time. She exhibits normal muscle tone.  Skin: Skin is warm and dry. No rash noted. No erythema. No pallor.  Psychiatric: She has a normal mood and affect. Her behavior is normal. Judgment  and thought content normal.          Assessment & Plan:

## 2011-03-04 NOTE — Assessment & Plan Note (Signed)
25 yr old female presents today for wellness visit.  Up to date on her immunizations.  Counseled pt on healthy diet, exercise and SBE.  Pap performed today.  Wet Prep and GC/Clamydia also sent today.  Await results and plan to treat accordingly. She will return for fasting lab work

## 2011-03-05 ENCOUNTER — Telehealth: Payer: Self-pay | Admitting: Family

## 2011-03-05 LAB — WET PREP BY MOLECULAR PROBE
Candida species: NEGATIVE
Trichomonas vaginosis: NEGATIVE

## 2011-03-05 MED ORDER — METRONIDAZOLE 0.75 % VA GEL: 1.0000 | Freq: Two times a day (BID) | VAGINAL | Status: AC

## 2011-03-05 NOTE — Telephone Encounter (Signed)
Left message on machine to return my call. 

## 2011-03-05 NOTE — Telephone Encounter (Signed)
Pt.notified

## 2011-03-05 NOTE — Telephone Encounter (Signed)
Pls call pt and let her know that her wet prep shows bacterial vaginosis- a common vaginal infection.  I would like for her to start metrogel.

## 2011-03-07 ENCOUNTER — Encounter: Payer: Self-pay | Admitting: Family

## 2011-03-12 NOTE — Telephone Encounter (Signed)
Received call from pt stating she felt like most of the Metrogel was not being absorbed at the morning application. Verified with Efraim Kaufmann that this can be expected and advised pt she can wear a pad if needed. Pt voices understanding.

## 2011-03-20 MED ORDER — METRONIDAZOLE 500 MG PO TABS: 500.0000 mg | ORAL_TABLET | Freq: Two times a day (BID) | ORAL | Status: AC

## 2011-03-20 NOTE — Telephone Encounter (Signed)
I recommend oral metronidazole bid x 7 days. If no improvement at completion of oral med will need follow up apt.

## 2011-03-20 NOTE — Telephone Encounter (Signed)
Pt requests that we call her work number tomorrow from 7am to 3:30pm if we don't get back with her tonight. Please advise.

## 2011-03-20 NOTE — Telephone Encounter (Signed)
Received call from pt stating she completed metrogel on Friday. Still has white vaginal discharge. Reports symptoms are about the same; no worse no better. Please advise.

## 2011-03-20 NOTE — Telephone Encounter (Signed)
Addended by: Sandford Craze on: 03/20/2011 05:02 PM   Modules accepted: Orders

## 2011-03-21 NOTE — Telephone Encounter (Signed)
Pt.notified

## 2011-03-25 ENCOUNTER — Telehealth: Payer: Self-pay | Admitting: *Deleted

## 2011-03-25 MED ORDER — FLUCONAZOLE 150 MG PO TABS: 150.0000 mg | ORAL_TABLET | Freq: Once | ORAL | Status: AC

## 2011-03-25 NOTE — Telephone Encounter (Signed)
Rx sent, pt notified; no med interactions flagged.

## 2011-03-25 NOTE — Telephone Encounter (Signed)
Pt called stating she thinks she may have a yeast infection. Has a few more days of metronidazole to complete but is having vaginal itching and irritation. States that she has tried Radio producer without relief. Please advise.

## 2011-03-25 NOTE — Telephone Encounter (Signed)
Diflucan 150mg  po x one. pls check med interaction

## 2011-03-26 NOTE — Telephone Encounter (Signed)
Pt states she will return for pending labs on 04/05/11.

## 2011-04-26 ENCOUNTER — Ambulatory Visit (INDEPENDENT_AMBULATORY_CARE_PROVIDER_SITE_OTHER): Payer: 59 | Admitting: Family Medicine

## 2011-04-26 VITALS — BP 116/76 | HR 89 | Temp 98.4°F | Ht 68.0 in | Wt 142.0 lb

## 2011-04-26 DIAGNOSIS — N898 Other specified noninflammatory disorders of vagina: Secondary | ICD-10-CM | POA: Insufficient documentation

## 2011-04-26 DIAGNOSIS — N730 Acute parametritis and pelvic cellulitis: Secondary | ICD-10-CM

## 2011-04-26 LAB — POCT WET PREP (WET MOUNT): Trichomonas Wet Prep HPF POC: NEGATIVE

## 2011-04-26 MED ORDER — DOXYCYCLINE HYCLATE 100 MG PO TABS: 100.0000 mg | ORAL_TABLET | Freq: Two times a day (BID) | ORAL | Status: AC

## 2011-04-26 MED ORDER — CEFTRIAXONE SODIUM 250 MG IJ SOLR
250.0000 mg | Freq: Once | INTRAMUSCULAR | Status: AC
  Administered 2011-04-26: 250 mg via INTRAMUSCULAR

## 2011-04-26 NOTE — Progress Notes (Signed)
Addended by: Adalberto Cole on: 04/26/2011 10:36 AM   Modules accepted: Orders

## 2011-04-26 NOTE — Progress Notes (Signed)
  Subjective:    Patient ID: Ashley Rush, female    DOB: 05-10-1985, 25 y.o.   MRN: [id number]  HPI Pt presents today with vaginal discharge x 1 week. Pt states that she has notice vaginal discharge since last week saturday. Discharge is not foul smelling. Most recent sexual acitivity was last week. This was unprotected. Pt has been with the same partner greater than 1 year. No hx/o STDs. No abdominal pain, nausea, or vomiting. Pt does report mild lower abdominal pain.    Review of Systems See HPI, otherwise ROS negative     Objective:   Physical Exam Gen: up in chair, NAD CV: RRR, no murmurs auscultated PULM: CTAB, no wheezes, rales, rhoncii ABD: S/NT/+ bowel sounds  GU: normal external genitalia , mild vaginal discharge, +CMT     Assessment & Plan:

## 2011-04-26 NOTE — Patient Instructions (Signed)
Pelvic Inflammatory Disease °Pelvic Inflammatory Disease (PID) is an infection in some or all of your female organs. This includes the womb (uterus), ovaries, fallopian tubes and tissues in the pelvis. PID is a common cause of sudden onset (acute) lower abdominal (pelvic) pain. PID can be treated, but it is a serious infection. It may take weeks before you are completely well. In some cases, hospitalization is needed for surgery or to administer medications to kill germs (antibiotics) through your veins (intravenously). °CAUSES  °· It may be caused by germs that are spread during sexual contact.  °· PID can also occur following:  °· The birth of a baby.  °· A miscarriage.  °· An abortion.  °· Major surgery of the pelvis.  °· Use of an IUD.  °· Sexual assault.  °SYMPTOMS  °· Abdominal or pelvic pain.  °· Fever.  °· Chills.  °· Abnormal vaginal discharge.  °DIAGNOSIS  °Your caregiver will choose some of these methods to make a diagnosis: °· A physical exam and history.  °· Blood tests.  °· Cultures of the vagina and cervix.  °· X-rays or ultrasound.  °· A procedure to look inside the pelvis (laparoscopy).  °TREATMENT  °· Use of antibiotics by mouth or intravenously.  °· Treatment of sexual partners when the infection is an sexually transmitted disease (STD).  °· Hospitalization and surgery may be needed.  °RISKS AND COMPLICATIONS  °· PID can cause women to become unable to have children (sterile) if left untreated or if partially treated. That is why it is important to finish all medications given to you.  °· Sterility or future tubal (ectopic) pregnancies can occur in fully treated individuals. This is why it is so important to follow your prescribed treatment.  °· It can cause longstanding (chronic) pelvic pain after frequent infections.  °· Painful intercourse.  °· Pelvic abscesses.  °· In rare cases, surgery or a hysterectomy may be needed.  °· If this is a sexually transmitted infection (STI), you are also at  risk for any other STD including AIDSor human papillomavirus (HPV).  °HOME CARE INSTRUCTIONS  °· Finish all medication as prescribed. Incomplete treatment will put you at risk for sterility and tubal pregnancy.  °· Only take over-the-counter or prescription medicines for pain, discomfort, or fever as directed by your caregiver.  °· Do not have sex until treatment is completed or as directed by your caregiver. If PID is confirmed, your recent sexual contacts will need treatment.  °· Keep your follow-up appointments.  °SEEK MEDICAL CARE IF:  °· You have increased or abnormal vaginal discharge.  °· You need prescription medication for your pain.  °· Your partner has an STD.  °· You are vomiting.  °· You cannot take your medications.  °SEEK IMMEDIATE MEDICAL CARE IF:  °· You have a fever.  °· You develop increased abdominal or pelvic pain.  °· You develop chills.  °· You have pain when you urinate.  °· You are not better after 72 hours following treatment.  °Document Released: 04/22/2005 Document Revised: 01/02/2011 Document Reviewed: 01/03/2007 °ExitCare® Patient Information ©2012 [employer]. °

## 2011-04-26 NOTE — Assessment & Plan Note (Addendum)
Given vaginal discharge and CMT, will treat for PID with rocephin and doxy. Wet prep, GC, Chl today. Discussed with pt at length overall dx of PID as well as safe sex practices. Will contact pt if there are any abnormal results.

## 2011-04-27 LAB — GC/CHLAMYDIA PROBE AMP, GENITAL: GC Probe Amp, Genital: NEGATIVE

## 2011-04-28 ENCOUNTER — Encounter: Payer: Self-pay | Admitting: Family Medicine

## 2011-05-20 ENCOUNTER — Encounter (HOSPITAL_COMMUNITY): Payer: Self-pay

## 2011-05-20 ENCOUNTER — Emergency Department (HOSPITAL_COMMUNITY)
Admission: EM | Admit: 2011-05-20 | Discharge: 2011-05-20 | Disposition: A | Discharge status: Home / Self Care | Payer: 59 | Source: Home / Self Care

## 2011-05-20 DIAGNOSIS — N760 Acute vaginitis: Secondary | ICD-10-CM

## 2011-05-20 LAB — WET PREP, GENITAL: Yeast Wet Prep HPF POC: NONE SEEN

## 2011-05-20 LAB — POCT URINALYSIS DIP (DEVICE)
Bilirubin Urine: NEGATIVE
Glucose, UA: NEGATIVE mg/dL
Hgb urine dipstick: NEGATIVE
Ketones, ur: NEGATIVE mg/dL
Specific Gravity, Urine: 1.015 (ref 1.005–1.030)
pH: 7 (ref 5.0–8.0)

## 2011-05-20 MED ORDER — FLUCONAZOLE 150 MG PO TABS: 150.0000 mg | ORAL_TABLET | Freq: Once | ORAL | Status: AC

## 2011-05-20 NOTE — ED Provider Notes (Signed)
History     CSN: [id number]  Arrival date & time 05/20/11  0847   None     Chief Complaint  Patient presents with  . Vaginal Discharge    (Consider location/radiation/quality/duration/timing/severity/associated sxs/prior treatment) HPI Comments: Pt presents with c/o vaginal discharge x 2 weeks. No itching odor or irritation. She denies previous hx of recurrent vaginal infections. No new partners. She has not tried anything for her symptoms.   The history is provided by the patient.    History reviewed. No pertinent past medical history.  Past Surgical History  Procedure Date  . Cesarean section 08/24/07    Family History  Problem Relation Age of Onset  . Diabetes Mother   . Heart disease Mother   . Immunodeficiency Mother     HIV- deceased at age 16  . Diabetes Father   . Heart disease Father   . Immunodeficiency Father     HIV- died last year at 35    History  Substance Use Topics  . Smoking status: Never Smoker   . Smokeless tobacco: Never Used  . Alcohol Use: No    OB History    Grav Para Term Preterm Abortions TAB SAB Ect Mult Living                  Review of Systems  Gastrointestinal: Negative for nausea, vomiting and abdominal pain.  Genitourinary: Negative for dysuria, frequency and pelvic pain.    Allergies  Review of patient's allergies indicates no known allergies.  Home Medications   Current Outpatient Rx  Name Route Sig Dispense Refill  . CALCIUM CARBONATE-VITAMIN D 600-400 MG-UNIT PO TABS Oral Take 1 tablet by mouth 2 (two) times daily.      Marland Kitchen FLUCONAZOLE 150 MG PO TABS Oral Take 1 tablet (150 mg total) by mouth once. 1 tablet 0    BP 121/77  Pulse 84  Temp(Src) 98.7 F (37.1 C) (Oral)  Resp 16  SpO2 100%  LMP 05/01/2011  Physical Exam  Nursing note and vitals reviewed. Constitutional: She appears well-developed and well-nourished. No distress.  Cardiovascular: Normal rate, regular rhythm and normal heart sounds.     Pulmonary/Chest: Effort normal and breath sounds normal. No respiratory distress.  Genitourinary: Uterus normal. There is no rash, tenderness or lesion on the right labia. There is no rash, tenderness or lesion on the left labia. Cervix exhibits no motion tenderness, no discharge and no friability. Right adnexum displays no mass, no tenderness and no fullness. Left adnexum displays no mass, no tenderness and no fullness. No erythema, tenderness or bleeding around the vagina. No foreign body around the vagina. No signs of injury around the vagina. Vaginal discharge found.  Neurological: She is alert.  Skin: Skin is warm and dry.  Psychiatric: She has a normal mood and affect.    ED Course  Procedures (including critical care time)   Labs Reviewed  POCT URINALYSIS DIP (DEVICE)  POCT PREGNANCY, URINE  POCT URINALYSIS DIPSTICK  POCT PREGNANCY, URINE  GC/CHLAMYDIA PROBE AMP, GENITAL  WET PREP, GENITAL   No results found.   1. Vaginitis       MDM  GC/Chlamydia and wet prep pending.        Melody [place], [place] 05/20/11 1109

## 2011-05-20 NOTE — ED Provider Notes (Signed)
Medical screening examination/treatment/procedure(s) were performed by non-physician practitioner and as supervising physician I was immediately available for consultation/collaboration.  Luiz Blare MD   Luiz Blare, MD 05/20/11 [phone]

## 2011-05-20 NOTE — ED Notes (Signed)
C/o vaginal discharge for 2 weeks.  Denies itching, odor or urinary sx.  No OTC treatments tried.

## 2011-05-21 LAB — GC/CHLAMYDIA PROBE AMP, GENITAL
Chlamydia, DNA Probe: NEGATIVE
GC Probe Amp, Genital: NEGATIVE

## 2011-05-22 ENCOUNTER — Telehealth (HOSPITAL_COMMUNITY): Payer: Self-pay | Admitting: *Deleted

## 2011-05-22 MED ORDER — METRONIDAZOLE 500 MG PO TABS: 500.0000 mg | ORAL_TABLET | Freq: Two times a day (BID) | ORAL | Status: AC

## 2011-05-22 NOTE — ED Notes (Signed)
GC/Chlamydia neg., Wet prep: Many clue cells, rare WBC's. Order obtained from [facility] for Flagyl.  I called and left message for pt. to call. Vassie Moselle 05/22/2011

## 2011-05-22 NOTE — ED Notes (Signed)
Pt. verified, notified and Rx. called ( see telephone encounter). Ashley Rush 05/22/2011

## 2011-10-04 ENCOUNTER — Ambulatory Visit: Payer: 59 | Admitting: Family

## 2011-10-07 ENCOUNTER — Ambulatory Visit: Payer: 59 | Admitting: Family

## 2011-10-11 ENCOUNTER — Telehealth: Payer: Self-pay | Admitting: *Deleted

## 2011-10-11 ENCOUNTER — Ambulatory Visit (INDEPENDENT_AMBULATORY_CARE_PROVIDER_SITE_OTHER): Payer: 59 | Admitting: Family

## 2011-10-11 ENCOUNTER — Encounter: Payer: Self-pay | Admitting: Family

## 2011-10-11 VITALS — BP 130/90 | HR 71 | Temp 98.0°F | Resp 16 | Ht 68.0 in | Wt 148.1 lb

## 2011-10-11 DIAGNOSIS — N921 Excessive and frequent menstruation with irregular cycle: Secondary | ICD-10-CM

## 2011-10-11 DIAGNOSIS — N923 Ovulation bleeding: Secondary | ICD-10-CM

## 2011-10-11 NOTE — Assessment & Plan Note (Signed)
Likely related to starting OCP. Counseled pt that this is common the first few months on an OCP. Will check urine HCG today to be certain that she is not pregnant.

## 2011-10-11 NOTE — Progress Notes (Signed)
Patient ID: Ashley Rush, female   DOB: 23-Nov-1985, 26 y.o.   MRN: [id number]   Ashley Rush is a 26 yr old female who presents today to discuss menstrual spotting. She reports that she started an oral contraceptive 3.5 weeks ago.  She has been spotting x 10 days.  Initially, had some cramping, but none this week.  Had normal period last month. Denies unusual breast tenderness.  ROS  See HPI  No past medical history on file.  History   Social History  . Marital Status: Single    Spouse Name: N/A    Number of Children: 1  . Years of Education: N/A   Occupational History  .  [place]    works in [employer]   Social History Main Topics  . Smoking status: Never Smoker   . Smokeless tobacco: Never Used  . Alcohol Use: No  . Drug Use: No  . Sexually Active: Not on file   Other Topics Concern  . Not on file   Social History Narrative   Caffeine use: 2 sodas weeklyRegular exercise:  NoLives with her daughter who is 4 years old.  SaniyhaWorks at [employer] in [employer]. Completed 12 grade.Single.Never smoked. Denies drugs or alcohol.    Past Surgical History  Procedure Date  . Cesarean section 08/24/07    Family History  Problem Relation Age of Onset  . Diabetes Mother   . Heart disease Mother   . Immunodeficiency Mother     HIV- deceased at age 60  . Diabetes Father   . Heart disease Father   . Immunodeficiency Father     HIV- died last year at 41    No Known Allergies  Current Outpatient Prescriptions on File Prior to Visit  Medication Sig Dispense Refill  . Calcium Carbonate-Vitamin D (CALTRATE 600+D) 600-400 MG-UNIT per tablet Take 1 tablet by mouth 2 (two) times daily.        . norethindrone-ethinyl estradiol 1/35 (ORTHO-NOVUM, NORTREL,CYCLAFEM) tablet Take 1 tablet by mouth daily.        BP 130/90  Pulse 71  Temp(Src) 98 F (36.7 C) (Oral)  Resp 16  Ht 5\' 8"  (1.727 m)  Wt 148 lb 1.9 oz (67.187 kg)  BMI 22.52 kg/m2  SpO2 98%  LMP  09/09/2011    Physical Exam  Gen: awake, alert, NAD CV: s1/s2, RRR, no murmur Resp: BS cta bilaterally without wheezes, rales, rhonchi. Abd: soft, non-tender. Psych: a and o x3.

## 2011-10-11 NOTE — Telephone Encounter (Signed)
Pt returned my call and was notified of result. 

## 2011-10-11 NOTE — Progress Notes (Signed)
Addended by: Mervin Kung A on: 10/11/2011 03:56 PM   Modules accepted: Orders

## 2011-10-11 NOTE — Patient Instructions (Signed)
Please follow up as needed 

## 2011-10-11 NOTE — Telephone Encounter (Signed)
Urine HCG is negative. Left message on cell# to return my call.

## 2012-07-20 ENCOUNTER — Encounter: Payer: 59 | Admitting: Family

## 2012-07-29 ENCOUNTER — Encounter: Payer: 59 | Admitting: Family

## 2012-10-22 ENCOUNTER — Encounter: Payer: 59 | Admitting: Family

## 2012-10-22 DIAGNOSIS — Z0289 Encounter for other administrative examinations: Secondary | ICD-10-CM

## 2013-03-08 ENCOUNTER — Other Ambulatory Visit: Payer: Self-pay | Admitting: Family Medicine

## 2013-03-08 ENCOUNTER — Other Ambulatory Visit (HOSPITAL_COMMUNITY)
Admission: RE | Admit: 2013-03-08 | Discharge: 2013-03-08 | Disposition: A | Discharge status: Home / Self Care | Payer: 59 | Source: Ambulatory Visit | Attending: Family Medicine | Admitting: Family Medicine

## 2013-03-08 DIAGNOSIS — Z0000 Encounter for general adult medical examination without abnormal findings: Secondary | ICD-10-CM | POA: Insufficient documentation

## 2013-03-16 ENCOUNTER — Other Ambulatory Visit: Payer: Self-pay | Admitting: *Deleted

## 2013-03-16 DIAGNOSIS — R609 Edema, unspecified: Secondary | ICD-10-CM

## 2013-04-22 ENCOUNTER — Encounter: Payer: Self-pay | Admitting: Vascular Surgery

## 2013-04-23 ENCOUNTER — Encounter: Payer: Self-pay | Admitting: Vascular Surgery

## 2013-04-23 ENCOUNTER — Encounter (INDEPENDENT_AMBULATORY_CARE_PROVIDER_SITE_OTHER): Payer: Self-pay

## 2013-04-23 ENCOUNTER — Ambulatory Visit (HOSPITAL_COMMUNITY)
Admission: RE | Admit: 2013-04-23 | Discharge: 2013-04-23 | Disposition: A | Discharge status: Home / Self Care | Payer: 59 | Source: Ambulatory Visit | Attending: Vascular Surgery | Admitting: Vascular Surgery

## 2013-04-23 ENCOUNTER — Ambulatory Visit (INDEPENDENT_AMBULATORY_CARE_PROVIDER_SITE_OTHER): Payer: 59 | Admitting: Vascular Surgery

## 2013-04-23 VITALS — BP 119/81 | HR 90 | Ht 68.0 in | Wt 140.5 lb

## 2013-04-23 DIAGNOSIS — I890 Lymphedema, not elsewhere classified: Secondary | ICD-10-CM | POA: Insufficient documentation

## 2013-04-23 DIAGNOSIS — R609 Edema, unspecified: Secondary | ICD-10-CM

## 2013-04-23 NOTE — Progress Notes (Signed)
[facility] [place]  Referred by:  Sandford Craze, NP [id number] [place], [place] [address]  Reason for referral: R lymphedema History of Present Illness  Ashley Rush is a 27 y.o. ([birth year]) female who presents with chief complaint: lymphedema.  Patient has had R leg swelling since 27 years old.  She had had a lymphedema diagnosis since then.  The patient's swelling is variable and she has had recurrent episodes of cellulitis.  The patient has had no history of DVT, known history of SVT, known history of pregnancy, no history of varicose vein, no history of venous stasis ulcers, known history of  Lymphedema and no history of skin changes in lower legs.  There is no family history of venous disorders.  The patient has used thigh high 30 mm Hg compression stockings in the past.  PMH: Lymphedema  Past Surgical History  Procedure Laterality Date  . Cesarean section  08/24/07    History   Social History  . Marital Status: Single    Spouse Name: N/A    Number of Children: 1  . Years of Education: N/A   Occupational History  .  [place]    works in [employer]   Social History Main Topics  . Smoking status: Never Smoker   . Smokeless tobacco: Never Used  . Alcohol Use: 6.0 oz/week    10 Shots of liquor per week  . Drug Use: No  . Sexual Activity: Not on file   Other Topics Concern  . Not on file   Social History Narrative   Caffeine use: 2 sodas weekly   Regular exercise:  No   Lives with her daughter who is 18 years old.  Kennieth Rad   Works at [employer] in [employer].    Completed 12 grade.   Single.   Never smoked.    Denies drugs or alcohol.             Family History  Problem Relation Age of Onset  . Diabetes Mother   . Heart disease Mother   . Immunodeficiency Mother     HIV- deceased at age 51  . Hypertension Mother   . Diabetes Father   . Heart disease Father   . Immunodeficiency Father     HIV- died last year at  71  . Hypertension Father   . Varicose Veins Father   . Heart attack Father    Current Outpatient Prescriptions on File Prior to Visit  Medication Sig Dispense Refill  . Calcium Carbonate-Vitamin D (CALTRATE 600+D) 600-400 MG-UNIT per tablet Take 1 tablet by mouth 2 (two) times daily.        . norethindrone-ethinyl estradiol 1/35 (ORTHO-NOVUM, NORTREL,CYCLAFEM) tablet Take 1 tablet by mouth daily.       No current facility-administered medications on file prior to visit.    No Known Allergies    REVIEW OF SYSTEMS:  (Positives checked otherwise negative)  CARDIOVASCULAR:  []  chest pain, []  chest pressure, []  palpitations, []  shortness of breath when laying flat, []  shortness of breath with exertion,  []  pain in feet when walking, []  pain in feet when laying flat, []  history of blood clot in veins (DVT), []  history of phlebitis, [x]  swelling in legs, []  varicose veins  PULMONARY:  []  productive cough, []  asthma, []  wheezing  NEUROLOGIC:  []  weakness in arms or legs, []  numbness in arms or legs, []  difficulty speaking or slurred speech, []  temporary loss of vision in  one eye, []  dizziness  HEMATOLOGIC:  []  bleeding problems, []  problems with blood clotting too easily  MUSCULOSKEL:  []  joint pain, []  joint swelling  GASTROINTEST:  []  vomiting blood, []  blood in stool     GENITOURINARY:  []  burning with urination, []  blood in urine  PSYCHIATRIC:  []  history of major depression  INTEGUMENTARY:  []  rashes, []  ulcers  CONSTITUTIONAL:  []  fever, []  chills  Physical Examination Filed Vitals:   04/23/13 1534  BP: 119/81  Pulse: 90  Height: 5\' 8"  (1.727 m)  Weight: 140 lb 8 oz (63.73 kg)  SpO2: 100%   Body mass index is 21.37 kg/(m^2).  General: A&O x 3, WDWN  Head: [place]/AT  Ear/Nose/Throat: Hearing grossly intact, nares w/o erythema or drainage, oropharynx w/o Erythema/Exudate  Eyes: PERRLA, EOMI  Neck: Supple, no nuchal rigidity, no palpable LAD  Pulmonary: Sym exp,  good air movt, CTAB, no rales, rhonchi, & wheezing  Cardiac: RRR, Nl S1, S2, no Murmurs, rubs or gallops  Vascular: Vessel Right Left  Radial Palpable Palpable  Brachial Palpable Palpable  Carotid Palpable, without bruit Palpable, without bruit  Aorta Not palpable N/A  Femoral Palpable Palpable  Popliteal Not palpable Not palpable  PT Palpable Palpable  DP Palpable Palpable   Gastrointestinal: soft, NTND, -G/R, - HSM, - masses, - CVAT B  Musculoskeletal: M/S 5/5 throughout , Extremities without ischemic changes , R leg with some woody edema, +/- Karposi Stemmer sign  Neurologic: CN 2-12 intact , Pain and light touch intact in extremities , Motor exam as listed above  Psychiatric: Judgment intact, Mood & affect appropriate for pt's clinical situation  Dermatologic: See M/S exam for extremity exam, no rashes otherwise noted  Lymph : No Cervical, Axillary, or Inguinal lymphadenopathy   Non-Invasive Vascular Imaging  RLE Venous Insufficiency Duplex (Date: 04/23/2013):   No DVT and SVT, no GSV reflux, deep venous reflux in CFV  Outside Studies/Documentation 4 pages of outside documents were reviewed including: outpatient PCP note.  Medical Decision Making  Ashley Rush is a 27 y.o. female who presents with: RLE CVI (C2) and likely lymphedema   Based on the patient's history and examination, I recommend: compressive therapy and referral to Lymphedema clinic for lymphatic massage.  I discussed with the patient the use of her 30 mm thigh high compression stockings.  There is no indication for GSV ablation in this patient as her CFV has reflux not her GSV.  Her exam is suspicious for lymphedema.  Diagnosis would require lymphoscintography vs lymphangiography, neither which is commonly done anymore.  Therapeutically, compression with 30 mm Hg will manage both so I don't think the diagnostic study is indicated.  If she has significant infectious complications,  diagnostic testing might be beneficial if long-term suppressive abx therapy is being contemplated.  Thank you for allowing [place] to participate in this patient's care.  Leonides Sake, MD [facility] [place] Office: [phone] Pager: [phone]  04/23/2013, 4:16 PM

## 2015-05-29 MED FILL — diazePAM 5 MG TABS: 5 | 1 days supply | Qty: 1 | Fill #0

## 2015-05-29 MED FILL — AMOXICILLIN 500 MG CAPSULE: 500 | 7 days supply | Qty: 21 | Fill #0

## 2015-05-29 MED FILL — METHYLPREDNISOLONE 4 MG TAB: 4 | 6 days supply | Qty: 21 | Fill #0

## 2015-05-29 MED FILL — IBUPROFEN 800 MG TABLET: 800 | 5 days supply | Qty: 21 | Fill #0

## 2015-05-29 MED FILL — OXYCODONE/APAP 5-325: 5-325 | 6 days supply | Qty: 24 | Fill #0

## 2016-01-18 DIAGNOSIS — N898 Other specified noninflammatory disorders of vagina: Secondary | ICD-10-CM | POA: Diagnosis not present

## 2016-01-18 DIAGNOSIS — N760 Acute vaginitis: Secondary | ICD-10-CM | POA: Diagnosis not present

## 2016-01-18 MED FILL — metroNIDAZOLE 500 MG TABS: 500 | 7 days supply | Qty: 14 | Fill #0

## 2016-04-09 DIAGNOSIS — Z30432 Encounter for removal of intrauterine contraceptive device: Secondary | ICD-10-CM | POA: Diagnosis not present

## 2016-04-09 DIAGNOSIS — N898 Other specified noninflammatory disorders of vagina: Secondary | ICD-10-CM | POA: Diagnosis not present

## 2016-04-09 DIAGNOSIS — B373 Candidiasis of vulva and vagina: Secondary | ICD-10-CM | POA: Diagnosis not present

## 2016-04-09 MED FILL — FLUCONAZOLE 150 MG TABLET: 150 | 4 days supply | Qty: 2 | Fill #0

## 2016-04-09 MED FILL — LEVONOR-ETH ESTRAD 0.1-0.02: 0.1-20 | 28 days supply | Qty: 28 | Fill #0

## 2016-04-15 DIAGNOSIS — H5213 Myopia, bilateral: Secondary | ICD-10-CM | POA: Diagnosis not present

## 2016-05-08 MED FILL — LEVONOR-ETH ESTRAD 0.1-0.02: 0.1-20 | 28 days supply | Qty: 28 | Fill #1

## 2017-05-08 ENCOUNTER — Emergency Department (INDEPENDENT_AMBULATORY_CARE_PROVIDER_SITE_OTHER)
Admission: EM | Admit: 2017-05-08 | Discharge: 2017-05-08 | Disposition: A | Discharge status: Home / Self Care | Payer: 59 | Source: Home / Self Care | Attending: Family Medicine | Admitting: Family Medicine

## 2017-05-08 ENCOUNTER — Encounter: Payer: Self-pay | Admitting: Emergency Medicine

## 2017-05-08 ENCOUNTER — Encounter (HOSPITAL_COMMUNITY): Payer: Self-pay | Admitting: *Deleted

## 2017-05-08 ENCOUNTER — Inpatient Hospital Stay (HOSPITAL_COMMUNITY)
Admission: AD | Admit: 2017-05-08 | Discharge: 2017-05-09 | Disposition: A | Discharge status: Home / Self Care | Payer: 59 | Source: Ambulatory Visit | Attending: Obstetrics & Gynecology | Admitting: Obstetrics & Gynecology

## 2017-05-08 DIAGNOSIS — B9689 Other specified bacterial agents as the cause of diseases classified elsewhere: Secondary | ICD-10-CM | POA: Diagnosis not present

## 2017-05-08 DIAGNOSIS — N760 Acute vaginitis: Secondary | ICD-10-CM

## 2017-05-08 DIAGNOSIS — O4690 Antepartum hemorrhage, unspecified, unspecified trimester: Secondary | ICD-10-CM

## 2017-05-08 DIAGNOSIS — O209 Hemorrhage in early pregnancy, unspecified: Secondary | ICD-10-CM | POA: Diagnosis not present

## 2017-05-08 DIAGNOSIS — N923 Ovulation bleeding: Secondary | ICD-10-CM | POA: Diagnosis not present

## 2017-05-08 DIAGNOSIS — Z3201 Encounter for pregnancy test, result positive: Secondary | ICD-10-CM

## 2017-05-08 DIAGNOSIS — O3680X0 Pregnancy with inconclusive fetal viability, not applicable or unspecified: Secondary | ICD-10-CM

## 2017-05-08 DIAGNOSIS — O26891 Other specified pregnancy related conditions, first trimester: Secondary | ICD-10-CM | POA: Diagnosis not present

## 2017-05-08 LAB — POCT URINE PREGNANCY: Preg Test, Ur: POSITIVE — AB

## 2017-05-08 NOTE — MAU Note (Signed)
PT SAYSS HE WENT  TO [place] AT 730PM -   FOR LOWER ABD PAIN- AND  SPOTTING - ON A PAD.  TONIGHT POSITIVE  PREG TEST - TOLD  TO COME HERE    SPOTTING AND  CRAMPING  SAME NOW

## 2017-05-08 NOTE — ED Triage Notes (Signed)
Vaginal bleeding x 2 weeks, clots

## 2017-05-08 NOTE — ED Provider Notes (Signed)
Ashley [facility] C[facility]    CSN: [id number] Arrival date & time: 05/08/17  1849     History   Chief Complaint Chief Complaint  Patient presents with  . Vaginal Bleeding    HPI Ashley Rush is a 32 y.o. female.   HPI  Ashley Rush is a 32 y.o. female presenting to UC with c/o vaginal bleeding and spotting for 2 weeks. Pt reports "waking in a puddle of blood" about 2 weeks ago but thought it was her normal menses.  She had been spotting since then but yesterday and today she has had lower abdominal cramping and passing small clots.  She has had unprotected intercourse and is not on birth control but was no actively trying to become pregnant. Denies prior hx of miscarriage or ectopic pregnancy.  Denies fever, chills, n/v/d. Denies HA or dizziness.    History reviewed. No pertinent past medical history.  Patient Active Problem List   Diagnosis Date Noted  . Edema 04/23/2013  . Lymphedema 04/23/2013  . General medical examination 03/04/2011  . Spotting between menses 12/21/2010  . Screening for STDs (sexually transmitted diseases) 12/11/2010  . Screening for cholesterol level 12/11/2010  . Screening for diabetes mellitus 12/11/2010  . Acne 09/07/2010    Past Surgical History:  Procedure Laterality Date  . CESAREAN SECTION  08/24/07    OB History    No data available       Home Medications    Prior to Admission medications   Medication Sig Start Date End Date Taking? Authorizing Provider  ibuprofen (ADVIL,MOTRIN) 200 MG tablet Take 200 mg by mouth every 6 (six) hours as needed.   Yes [provider]  Calcium Carbonate-Vitamin D (CALTRATE 600+D) 600-400 MG-UNIT per tablet Take 1 tablet by mouth 2 (two) times daily.      [provider]  norethindrone-ethinyl estradiol 1/35 (ORTHO-NOVUM, NORTREL,CYCLAFEM) tablet Take 1 tablet by mouth daily.    [provider]    Family History Family History  Problem Relation Age of Onset  .  Diabetes Mother   . Heart disease Mother   . Immunodeficiency Mother        HIV- deceased at age 46  . Hypertension Mother   . Diabetes Father   . Heart disease Father   . Immunodeficiency Father        HIV- died last year at 71  . Hypertension Father   . Varicose Veins Father   . Heart attack Father     Social History Social History   Tobacco Use  . Smoking status: Never Smoker  . Smokeless tobacco: Never Used  Substance Use Topics  . Alcohol use: Yes    Alcohol/week: 6.0 oz    Types: 10 Shots of liquor per week  . Drug use: No     Allergies   Patient has no known allergies.   Review of Systems Review of Systems  Constitutional: Negative for chills, diaphoresis, fatigue and fever.  Gastrointestinal: Positive for abdominal pain (lower). Negative for diarrhea, nausea and vomiting.  Genitourinary: Positive for menstrual problem, pelvic pain ( cramping) and vaginal bleeding. Negative for decreased urine volume, dysuria, flank pain, frequency, hematuria, urgency, vaginal discharge and vaginal pain.  Musculoskeletal: Negative for back pain and myalgias.  Neurological: Negative for dizziness, syncope, light-headedness and headaches.     Physical Exam Triage Vital Signs ED Triage Vitals  Enc Vitals Group     BP 05/08/17 1929 120/76     Pulse Rate 05/08/17 1929  77     Resp --      Temp 05/08/17 1929 98.6 F (37 C)     Temp Source 05/08/17 1929 Oral     SpO2 05/08/17 1929 97 %     Weight 05/08/17 1931 159 lb (72.1 kg)     Height 05/08/17 1931 5\' 7"  (1.702 m)     Head Circumference --      Peak Flow --      Pain Score 05/08/17 1931 3     Pain Loc --      Pain Edu? --      Excl. in GC? --    No data found.  Updated Vital Signs BP 120/76 (BP Location: Right Arm)   Pulse 77   Temp 98.6 F (37 C) (Oral)   Ht 5\' 7"  (1.702 m)   Wt 159 lb (72.1 kg)   LMP 04/19/2017 (Approximate)   SpO2 97%   BMI 24.90 kg/m   Visual Acuity Right Eye Distance:   Left Eye  Distance:   Bilateral Distance:    Right Eye Near:   Left Eye Near:    Bilateral Near:     Physical Exam  Constitutional: She is oriented to person, place, and time. She appears well-developed and well-nourished. No distress.  HENT:  Head: Normocephalic and atraumatic.  Mouth/Throat: Oropharynx is clear and moist.  Eyes: EOM are normal.  Neck: Normal range of motion.  Cardiovascular: Normal rate and regular rhythm.  Pulmonary/Chest: Effort normal and breath sounds normal. No stridor. No respiratory distress. She has no wheezes. She has no rales.  Abdominal: Soft. Bowel sounds are normal. She exhibits no distension and no mass. There is tenderness. There is no rebound, no guarding and no CVA tenderness.  Soft, non-distended. Mild tenderness to suprapubic region and LLQ. No masses palpated. No rebound or guarding.   Musculoskeletal: Normal range of motion.  Neurological: She is alert and oriented to person, place, and time.  Skin: Skin is warm and dry. She is not diaphoretic.  Psychiatric: She has a normal mood and affect. Her behavior is normal.  Nursing note and vitals reviewed.    UC Treatments / Results  Labs (all labs ordered are listed, but only abnormal results are displayed) Labs Reviewed  POCT URINE PREGNANCY - Abnormal; Notable for the following components:      Result Value   Preg Test, Ur Positive (*)    All other components within normal limits    EKG  EKG Interpretation None       Radiology No results found.  Procedures Procedures (including critical care time)  Medications Ordered in UC Medications - No data to display   Initial Impression / Assessment and Plan / UC Course  I have reviewed the triage vital signs and the nursing notes.  Pertinent labs & imaging results that were available during my care of the patient were reviewed by me and considered in my medical decision making (see chart for details).     Urine Preg: POSITIVE Vitals:  WNL Pt appears well, NAD.  Non-toxic appearing.  Due to reported bleeding in unknown pregnancy, concern for miscarriage vs ectopic pregnancy. Strongly recommend pt go to [facility] for further evaluation this evening.  Pt declined EMS transport and feels comfortable driving herself POV.  Denies dizziness or lightheadedness. Abdominal cramping is mild.   Pt agreeable and understanding of risk of bleeding in pregnancy, agrees to go to [facility] this evening.   Final Clinical Impressions(s) /  UC Diagnoses   Final diagnoses:  Spotting between menses  Vaginal bleeding in pregnancy  Positive urine pregnancy test    ED Discharge Orders    None       Controlled Substance Prescriptions [place] Controlled Substance Registry consulted? Not Applicable   Rolla Platehelps, Horrace Hanak O, PA-C 05/08/17 1949

## 2017-05-09 ENCOUNTER — Ambulatory Visit: Payer: Self-pay | Admitting: Family Medicine

## 2017-05-09 ENCOUNTER — Inpatient Hospital Stay (HOSPITAL_COMMUNITY): Payer: 59

## 2017-05-09 ENCOUNTER — Encounter (HOSPITAL_COMMUNITY): Payer: Self-pay | Admitting: *Deleted

## 2017-05-09 DIAGNOSIS — O26891 Other specified pregnancy related conditions, first trimester: Secondary | ICD-10-CM

## 2017-05-09 DIAGNOSIS — O209 Hemorrhage in early pregnancy, unspecified: Secondary | ICD-10-CM

## 2017-05-09 DIAGNOSIS — N760 Acute vaginitis: Secondary | ICD-10-CM

## 2017-05-09 DIAGNOSIS — Z3A00 Weeks of gestation of pregnancy not specified: Secondary | ICD-10-CM | POA: Diagnosis not present

## 2017-05-09 DIAGNOSIS — B9689 Other specified bacterial agents as the cause of diseases classified elsewhere: Secondary | ICD-10-CM | POA: Diagnosis not present

## 2017-05-09 LAB — GC/CHLAMYDIA PROBE AMP (~~LOC~~) NOT AT ARMC
Chlamydia: NEGATIVE
Neisseria Gonorrhea: NEGATIVE

## 2017-05-09 LAB — WET PREP, GENITAL
Sperm: NONE SEEN
Trich, Wet Prep: NONE SEEN
Yeast Wet Prep HPF POC: NONE SEEN

## 2017-05-09 LAB — CBC
HCT: 35.4 % — ABNORMAL LOW (ref 36.0–46.0)
Hemoglobin: 12.2 g/dL (ref 12.0–15.0)
MCH: 30.7 pg (ref 26.0–34.0)
MCHC: 34.5 g/dL (ref 30.0–36.0)
MCV: 88.9 fL (ref 78.0–100.0)
Platelets: 197 10*3/uL (ref 150–400)
RBC: 3.98 MIL/uL (ref 3.87–5.11)
RDW: 12.3 % (ref 11.5–15.5)
WBC: 6.4 10*3/uL (ref 4.0–10.5)

## 2017-05-09 LAB — URINALYSIS, ROUTINE W REFLEX MICROSCOPIC
BILIRUBIN URINE: NEGATIVE
Glucose, UA: NEGATIVE mg/dL
Ketones, ur: NEGATIVE mg/dL
LEUKOCYTES UA: NEGATIVE
NITRITE: NEGATIVE
Protein, ur: NEGATIVE mg/dL
SPECIFIC GRAVITY, URINE: 1.011 (ref 1.005–1.030)
pH: 9 — ABNORMAL HIGH (ref 5.0–8.0)

## 2017-05-09 LAB — HCG, QUANTITATIVE, PREGNANCY: hCG, Beta Chain, Quant, S: 4991 m[IU]/mL — ABNORMAL HIGH (ref <5)

## 2017-05-09 MED ORDER — METRONIDAZOLE 500 MG PO TABS: 500.0000 mg | ORAL_TABLET | Freq: Two times a day (BID) | ORAL | 0 refills | Status: DC

## 2017-05-09 MED FILL — metroNIDAZOLE 500 MG TABS: 500 | 7 days supply | Qty: 14 | Fill #0

## 2017-05-09 NOTE — MAU Provider Note (Signed)
Chief Complaint: Abdominal Pain and Vaginal bleeding    First Provider Initiated Contact with Patient 05/09/17 0018     SUBJECTIVE HPI: Ashley Rush is a 32 y.o. [id number] at [id number] by LMP who presents to maternity admissions reporting vaginal bleeding and abdominal pain. She was seen in [place] at urgent care earlier today who informed her she was pregnant and to [facility] to rule out ectopic and miscarriage due to symptoms. She reports vaginal bleeding started two weeks ago, describes as dark red bleeding "like a period". Abdominal cramping has been intermittent for last two weeks with bleeding. She has not taken anything for pain- rates pain 4/10. Abdominal pain is specified to lower abdominal region. She denies vaginal itching/burning, urinary symptoms, h/a, dizziness, n/v, or fever/chills.    History reviewed. No pertinent past medical history. Past Surgical History:  Procedure Laterality Date  . CESAREAN SECTION  08/24/07   Social History   Socioeconomic History  . Marital status: Single    Spouse name: Not on file  . Number of children: 1  . Years of education: Not on file  . Highest education level: Not on file  Social Needs  . Financial resource strain: Not on file  . Food insecurity - worry: Not on file  . Food insecurity - inability: Not on file  . Transportation needs - medical: Not on file  . 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 Smoking status: Never Smoker  . Smokeless tobacco: Never Used  Substance and Sexual Activity  . Alcohol use: Yes    Alcohol/week: 6.0 oz    Types: 10 Shots of liquor per week  . Drug use: No  . Sexual activity: Not on file  Other Topics Concern  . Not on file  Social History Narrative   Caffeine use: 2 sodas weekly   Regular exercise:  No   Lives with her daughter who is 33 years old.  Kennieth RadSaniyha   Works  at [employer] in [employer].    Completed 12 grade.   Single.   Never smoked.    Denies drugs or alcohol.            No current facility-administered medications on file prior to encounter.    Current Outpatient Medications on File Prior to Encounter  Medication Sig Dispense Refill  . Calcium Carbonate-Vitamin D (CALTRATE 600+D) 600-400 MG-UNIT per tablet Take 1 tablet by mouth 2 (two) times daily.      Marland Kitchen. ibuprofen (ADVIL,MOTRIN) 200 MG tablet Take 200 mg by mouth every 6 (six) hours as needed.    . norethindrone-ethinyl estradiol 1/35 (ORTHO-NOVUM, NORTREL,CYCLAFEM) tablet Take 1 tablet by mouth daily.     Allergies  Allergen Reactions  . Shellfish Allergy Swelling    ROS:  Review of Systems  Constitutional: Negative.   Respiratory: Negative.   Cardiovascular: Negative.   Gastrointestinal: Positive for abdominal pain. Negative for constipation, diarrhea, nausea and vomiting.  Genitourinary: Positive for vaginal bleeding. Negative for difficulty urinating, dysuria, frequency, pelvic pain, urgency and vaginal pain.  Musculoskeletal: Negative.   Neurological: Negative.   Psychiatric/Behavioral: Negative.    I have reviewed patient's Past Medical Hx, Surgical Hx, Family Hx, Social Hx, medications and allergies.   Physical Exam   Patient Vitals for the past 24 hrs:  BP Temp Temp src Pulse Resp Height Weight  05/09/17 0205  124/67 - - 73 - - -  05/08/17 2359 133/73 98.8 F (37.1 C) Oral 81 18 5\' 7"  (1.702 m) 160 lb 4 oz (72.7 kg)   Constitutional: Well-developed, well-nourished female in no acute distress.  Cardiovascular: normal rate Respiratory: normal effort GI: Abd soft, non-tender. Pos BS x 4 MS: Extremities nontender, no edema, normal ROM Neurologic: Alert and oriented x 4.  GU: Neg CVAT.  PELVIC EXAM: Cervix pink, visually closed, without lesion, moderate bright red bleeding with no clots, vaginal walls and external genitalia normal Bimanual exam: Cervix 0/long/high,  firm, anterior, neg CMT, uterus tender with palpation, nonenlarged, left adnexa tender, no mass palpated on left, right adnexa nontender with no enlargement, or mass  LAB RESULTS Results for orders placed or performed during the hospital encounter of 05/08/17 (from the past 24 hour(s))  Urinalysis, Routine w reflex microscopic     Status: Abnormal   Collection Time: 05/09/17 12:01 AM  Result Value Ref Range   Color, Urine YELLOW YELLOW   APPearance CLEAR CLEAR   Specific Gravity, Urine 1.011 1.005 - 1.030   pH 9.0 (H) 5.0 - 8.0   Glucose, UA NEGATIVE NEGATIVE mg/dL   Hgb urine dipstick LARGE (A) NEGATIVE   Bilirubin Urine NEGATIVE NEGATIVE   Ketones, ur NEGATIVE NEGATIVE mg/dL   Protein, ur NEGATIVE NEGATIVE mg/dL   Nitrite NEGATIVE NEGATIVE   Leukocytes, UA NEGATIVE NEGATIVE   RBC / HPF 0-5 0 - 5 RBC/hpf   WBC, UA 0-5 0 - 5 WBC/hpf   Bacteria, UA RARE (A) NONE SEEN   Squamous Epithelial / LPF 6-30 (A) NONE SEEN  CBC     Status: Abnormal   Collection Time: 05/09/17 12:03 AM  Result Value Ref Range   WBC 6.4 4.0 - 10.5 K/uL   RBC 3.98 3.87 - 5.11 MIL/uL   Hemoglobin 12.2 12.0 - 15.0 g/dL   HCT 95.2 (L) 84.1 - 32.4 %   MCV 88.9 78.0 - 100.0 fL   MCH 30.7 26.0 - 34.0 pg   MCHC 34.5 30.0 - 36.0 g/dL   RDW 40.1 02.7 - 25.3 %   Platelets 197 150 - 400 K/uL  hCG, quantitative, pregnancy     Status: Abnormal   Collection Time: 05/09/17 12:03 AM  Result Value Ref Range   hCG, Beta Chain, Quant, S 4,991 (H) <5 mIU/mL  Wet prep, genital     Status: Abnormal   Collection Time: 05/09/17 12:30 AM  Result Value Ref Range   Yeast Wet Prep HPF POC NONE SEEN NONE SEEN   Trich, Wet Prep NONE SEEN NONE SEEN   Clue Cells Wet Prep HPF POC PRESENT (A) NONE SEEN   WBC, Wet Prep HPF POC FEW (A) NONE SEEN   Sperm NONE SEEN      IMAGING US Ob Less Than 14 Weeks With Ob Transvaginal  Result Date: 05/09/2017 CLINICAL DATA:  Initial evaluation for abdominal cramping with vaginal bleeding for 2  weeks. EXAM: OBSTETRIC <14 WK [place] AND TRANSVAGINAL OB US TECHNIQUE: Both transabdominal and transvaginal ultrasound examinations were performed for complete evaluation of the gestation as well as the maternal uterus, adnexal regions, and pelvic cul-de-sac. Transvaginal technique was performed to assess early pregnancy. COMPARISON:  None. FINDINGS: Intrauterine gestational sac: Not visualized. Endometrium thickened up to 19 mm and complex in appearance. No associated vascularity. Yolk sac:  Absent Embryo:  Absent Cardiac Activity: N/A Heart Rate: N/A  bpm Subchorionic hemorrhage:  None visualized. Maternal uterus/adnexae: Left ovary normal in appearance. Small  corpus luteal cyst noted within the right ovary. Small corpus luteal cyst noted within the right ovary. No free fluid within the pelvis. IMPRESSION: 1. Early pregnancy with no discrete IUP or adnexal mass identified. Finding is consistent with a pregnancy of unknown anatomic location. Differential considerations include IUP to early to visualize, recent SAB, or possibly occult ectopic pregnancy. Close clinical monitoring with serial beta HCGs and close interval follow-up ultrasound recommended as clinically warranted. 2. No other acute maternal uterine or adnexal abnormality. Electronically Signed   By: Rise Mu M.D.   On: 05/09/2017 01:30    MAU Management/MDM: Orders Placed This Encounter  Procedures  . Wet prep, genital  . US OB LESS THAN 14 WEEKS WITH OB TRANSVAGINAL  . CBC  . hCG, quantitative, pregnancy  . Urinalysis, Routine w reflex microscopic  Wet prep- showed BV, treatment with Flagyl   Meds ordered this encounter  Medications  . metroNIDAZOLE (FLAGYL) 500 MG tablet    Sig: Take 1 tablet (500 mg total) by mouth 2 (two) times daily.    Dispense:  14 tablet    Refill:  0    Order Specific Question:   Supervising Provider    Answer:   Willodean Rosenthal [[id number]]   Consult Dr Erin Fulling with lab results and  ultrasound, she recommends repeat quant in 48 hours and close follow up with ectopic precautions.  Pt discharged with strict ectopic precautions and to return on Sunday morning for repeat lab work.  ASSESSMENT 1. Pregnancy of unknown anatomic location   2. Vaginal bleeding in pregnancy, first trimester   3. BV (bacterial vaginosis)     PLAN Discharge home Follow up on Sunday morning with repeat lab work and [place]  Return to MAU with increased pain  Rx for Flagyl    Allergies as of 05/09/2017      Reactions   Shellfish Allergy Swelling      Medication List    STOP taking these medications   ibuprofen 200 MG tablet Commonly known as:  ADVIL,MOTRIN   norethindrone-ethinyl estradiol 1/35 tablet Commonly known as:  ORTHO-NOVUM, NORTREL,CYCLAFEM     TAKE these medications   CALTRATE 600+D 600-400 MG-UNIT tablet Generic drug:  Calcium Carbonate-Vitamin D Take 1 tablet by mouth 2 (two) times daily.   metroNIDAZOLE 500 MG tablet Commonly known as:  FLAGYL Take 1 tablet (500 mg total) by mouth 2 (two) times daily.      Steward Drone  Certified Nurse-Midwife 05/09/2017  12:38 AM

## 2017-05-11 ENCOUNTER — Inpatient Hospital Stay (HOSPITAL_COMMUNITY)
Admission: AD | Admit: 2017-05-11 | Discharge: 2017-05-11 | Disposition: A | Discharge status: Home / Self Care | Payer: 59 | Source: Ambulatory Visit | Attending: Obstetrics & Gynecology | Admitting: Obstetrics & Gynecology

## 2017-05-11 DIAGNOSIS — Z91013 Allergy to seafood: Secondary | ICD-10-CM | POA: Insufficient documentation

## 2017-05-11 DIAGNOSIS — N939 Abnormal uterine and vaginal bleeding, unspecified: Secondary | ICD-10-CM | POA: Diagnosis not present

## 2017-05-11 DIAGNOSIS — Z3A00 Weeks of gestation of pregnancy not specified: Secondary | ICD-10-CM | POA: Insufficient documentation

## 2017-05-11 DIAGNOSIS — O26899 Other specified pregnancy related conditions, unspecified trimester: Secondary | ICD-10-CM | POA: Diagnosis not present

## 2017-05-11 DIAGNOSIS — Z79899 Other long term (current) drug therapy: Secondary | ICD-10-CM | POA: Insufficient documentation

## 2017-05-11 DIAGNOSIS — O209 Hemorrhage in early pregnancy, unspecified: Secondary | ICD-10-CM | POA: Diagnosis not present

## 2017-05-11 DIAGNOSIS — R109 Unspecified abdominal pain: Secondary | ICD-10-CM | POA: Insufficient documentation

## 2017-05-11 DIAGNOSIS — O3680X0 Pregnancy with inconclusive fetal viability, not applicable or unspecified: Secondary | ICD-10-CM

## 2017-05-11 LAB — HCG, QUANTITATIVE, PREGNANCY: hCG, Beta Chain, Quant, S: 3693 m[IU]/mL — ABNORMAL HIGH (ref <5)

## 2017-05-11 NOTE — Discharge Instructions (Signed)
-   Follow up in 2 days for repeat blood work. This is very important - Return here at any point if you pain become severe, uncontrolled with over the counter medications, or you start feeling dizzy, lightheaded, any fever, or any other new concerning symptoms.

## 2017-05-11 NOTE — MAU Provider Note (Signed)
History  CSN: [id number] Arrival date and time: 05/11/17 [id number]  First Provider Initiated Contact with Patient 05/11/17 1111      Chief Complaint  Patient presents with  . Follow-up  . Vaginal Bleeding    HPI: Ashley Rush is a 32 y.o. [id number] who presents to maternity admissions for follow up. She was seen 2 days ago for early pregnancy with vaginal bleeding and abdominal pain with pregnancy of unknown location. Here for repeat lab. She reports vaginal bleeding is lighter. Still having some abdominal cramping, but mild as well. Denies any new concerns, including no fever, chills, dizziness, or lightheadedness.  Past obstetric history: OB History  Gravida Para Term Preterm AB Living  4 1 1   2 1   SAB TAB Ectopic Multiple Live Births    2     1    # Outcome Date GA Lbr Len/2nd Weight Sex Delivery Anes PTL Lv  4 Current           3 TAB           2 TAB           1 Term      [id number]-[id number]         No past medical history on file. Past Surgical History:  Procedure Laterality Date  . CESAREAN SECTION  08/24/07   Social History   Socioeconomic History  . Marital status: Single    Spouse name: Not on file  . Number of children: 1  . Years of education: Not on file  . Highest education level: Not on file  Social Needs  . Financial resource strain: Not on file  . Food insecurity - worry: Not on file  . Food insecurity - inability: Not on file  . Transportation needs - medical: Not on file  . 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 Smoking status: Never Smoker  . Smokeless tobacco: Never Used  Substance and Sexual Activity  . Alcohol use: Yes    Alcohol/week: 6.0 oz    Types: 10 Shots of liquor per week  . Drug use: No  . Sexual activity: Not on file  Other Topics Concern  . Not on file  Social History Narrative   Caffeine use: 2 sodas weekly   Regular exercise:   No   Lives with her daughter who is 33 years old.  Kennieth RadSaniyha   Works at [employer] in [employer].    Completed 12 grade.   Single.   Never smoked.    Denies drugs or alcohol.            Allergies  Allergen Reactions  . Shellfish Allergy Swelling    Medications Prior to Admission  Medication Sig Dispense Refill Last Dose  . metroNIDAZOLE (FLAGYL) 500 MG tablet Take 1 tablet (500 mg total) by mouth 2 (two) times daily. 14 tablet 0 05/11/2017 at Unknown time    I have reviewed patient's Past Medical Hx, Surgical Hx, Family Hx, Social Hx, medications and allergies.   Review of Systems - Negative except for what is mentioned in HPI.  Physical Exam   Blood pressure 122/69, pulse 78, temperature 98.3 F (36.8 C), temperature source Oral, resp. rate 18, last menstrual period 04/19/2017.  Constitutional: Well-developed, well-nourished female in no acute distress.  HENT: [place]/AT Eyes: normal conjunctivae, no scleral icterus Cardiovascular: normal rate Respiratory: normal  effort GI: Abd soft, non-tender, non-distended  MSK: Extremities nontender, no edema Neurologic: Alert and oriented x 4. Psych: Normal mood and affect Skin: warm and dry  MAU Course/MDM:   Nursing notes and VS reviewed. Patient seen and examined, as noted above.   Repeat Quant ordered.   Results reviewed:  Results for orders placed or performed during the hospital encounter of 05/11/17  hCG, quantitative, pregnancy  Result Value Ref Range   hCG, Beta Chain, Quant, S 3,693 (H) <5 mIU/mL   Quant with 25% decrease.  She is Rh positive.  She is stable on exam as above and not having increased abdominal pain.  Assessment and Plan  Assessment: 1. Pregnancy of unknown anatomic location     Plan: --Discussed with patient ddx including threatened miscarriage and ectopic pregnancy.  --Will f/u in another 48 hours for repeat quant.  --Reviewed cctopic precautions extensively  --Encouraged to return here or to  other Urgent Care/ED if she develops worsening of symptoms, increase in pain, fever, or other concerning symptoms.    Jael Kostick, Kandra Nicolas, MD 05/11/2017 11:22 AM  Follow-up Information    [facility]   Specialty:  Obstetrics and Gynecology Why:  at 11:00 am for repeat blood work Contact information: [address] [place] [address] [phone]

## 2017-05-11 NOTE — MAU Note (Signed)
Patient presents for f/u HCG, still having vaginal bleeding which is lighter, still having cramping.

## 2017-05-13 ENCOUNTER — Other Ambulatory Visit: Payer: Self-pay

## 2017-05-13 ENCOUNTER — Inpatient Hospital Stay (HOSPITAL_COMMUNITY)
Admission: AD | Admit: 2017-05-13 | Discharge: 2017-05-13 | Disposition: A | Discharge status: Home / Self Care | Payer: 59 | Source: Ambulatory Visit | Attending: Obstetrics and Gynecology | Admitting: Obstetrics and Gynecology

## 2017-05-13 ENCOUNTER — Encounter (HOSPITAL_COMMUNITY): Payer: Self-pay

## 2017-05-13 ENCOUNTER — Inpatient Hospital Stay (HOSPITAL_COMMUNITY): Payer: 59

## 2017-05-13 ENCOUNTER — Ambulatory Visit (INDEPENDENT_AMBULATORY_CARE_PROVIDER_SITE_OTHER): Payer: 59 | Admitting: General Practice

## 2017-05-13 DIAGNOSIS — O209 Hemorrhage in early pregnancy, unspecified: Secondary | ICD-10-CM | POA: Diagnosis not present

## 2017-05-13 DIAGNOSIS — Z91013 Allergy to seafood: Secondary | ICD-10-CM | POA: Diagnosis not present

## 2017-05-13 DIAGNOSIS — O26851 Spotting complicating pregnancy, first trimester: Secondary | ICD-10-CM | POA: Diagnosis not present

## 2017-05-13 DIAGNOSIS — Z3A00 Weeks of gestation of pregnancy not specified: Secondary | ICD-10-CM | POA: Diagnosis not present

## 2017-05-13 DIAGNOSIS — IMO0002 Reserved for concepts with insufficient information to code with codable children: Secondary | ICD-10-CM | POA: Diagnosis present

## 2017-05-13 DIAGNOSIS — O3680X0 Pregnancy with inconclusive fetal viability, not applicable or unspecified: Secondary | ICD-10-CM

## 2017-05-13 DIAGNOSIS — Z79899 Other long term (current) drug therapy: Secondary | ICD-10-CM | POA: Insufficient documentation

## 2017-05-13 DIAGNOSIS — Z3A01 Less than 8 weeks gestation of pregnancy: Secondary | ICD-10-CM | POA: Insufficient documentation

## 2017-05-13 DIAGNOSIS — Z3490 Encounter for supervision of normal pregnancy, unspecified, unspecified trimester: Secondary | ICD-10-CM

## 2017-05-13 DIAGNOSIS — R799 Abnormal finding of blood chemistry, unspecified: Secondary | ICD-10-CM | POA: Diagnosis present

## 2017-05-13 LAB — URINALYSIS, ROUTINE W REFLEX MICROSCOPIC
BACTERIA UA: NONE SEEN
Bilirubin Urine: NEGATIVE
Glucose, UA: NEGATIVE mg/dL
KETONES UR: NEGATIVE mg/dL
NITRITE: NEGATIVE
Protein, ur: NEGATIVE mg/dL
SPECIFIC GRAVITY, URINE: 1.009 (ref 1.005–1.030)
pH: 7 (ref 5.0–8.0)

## 2017-05-13 LAB — CBC
HEMATOCRIT: 38.4 % (ref 36.0–46.0)
HEMOGLOBIN: 13.2 g/dL (ref 12.0–15.0)
MCH: 30.2 pg (ref 26.0–34.0)
MCHC: 34.4 g/dL (ref 30.0–36.0)
MCV: 87.9 fL (ref 78.0–100.0)
Platelets: 202 10*3/uL (ref 150–400)
RBC: 4.37 MIL/uL (ref 3.87–5.11)
RDW: 12.1 % (ref 11.5–15.5)
WBC: 4.6 10*3/uL (ref 4.0–10.5)

## 2017-05-13 LAB — COMPREHENSIVE METABOLIC PANEL
ALBUMIN: 4.1 g/dL (ref 3.5–5.0)
ALK PHOS: 38 U/L (ref 38–126)
ALT: 27 U/L (ref 14–54)
ANION GAP: 8 (ref 5–15)
AST: 32 U/L (ref 15–41)
BUN: 11 mg/dL (ref 6–20)
CHLORIDE: 103 mmol/L (ref 101–111)
CO2: 26 mmol/L (ref 22–32)
Calcium: 9.5 mg/dL (ref 8.9–10.3)
Creatinine, Ser: 0.76 mg/dL (ref 0.44–1.00)
GFR calc Af Amer: >60 mL/min (ref >60)
GFR calc non Af Amer: >60 mL/min (ref >60)
GLUCOSE: 99 mg/dL (ref 65–99)
POTASSIUM: 3.9 mmol/L (ref 3.5–5.1)
SODIUM: 137 mmol/L (ref 135–145)
Total Bilirubin: 1 mg/dL (ref 0.3–1.2)
Total Protein: 7.9 g/dL (ref 6.5–8.1)

## 2017-05-13 LAB — TYPE AND SCREEN
ABO/RH(D): O POS
Antibody Screen: NEGATIVE

## 2017-05-13 LAB — HCG, QUANTITATIVE, PREGNANCY: hCG, Beta Chain, Quant, S: 2720 m[IU]/mL — ABNORMAL HIGH (ref <5)

## 2017-05-13 MED ORDER — METHOTREXATE INJECTION FOR WOMEN'S HOSPITAL
50.0000 mg/m2 | Freq: Once | INTRAMUSCULAR | Status: AC
  Administered 2017-05-13: 90 mg via INTRAMUSCULAR
  Filled 2017-05-13: qty 1.8

## 2017-05-13 NOTE — Progress Notes (Signed)
Patient presents to clinic for stat beta hcg. She states she is having some back pain and is still bleeding today. Also stated that the blood is starting to have a foul odor. Explained to patient the purpose of the lab draw and the need to wait 1.5-2 hours until results come back. Patient voiced understanding.

## 2017-05-13 NOTE — MAU Provider Note (Signed)
History     CSN: [id number]  Arrival date and time: 05/13/17 1412   First Provider Initiated Contact with Patient 05/13/17 1648      Chief Complaint  Patient presents with  . Questionable Miscarriage   HPI  Ms.  Ashley Rush is a 32 y.o. year old [id number] female at 2321w3d weeks gestation who presents to MAU from [facility] reporting intermittent heavy VB and spotting that has a "funny smell now". She is scheduled to have an outpatient U/S tomorrow, but is unable to keep that appt and is concerned with the "funny smelling" bleeding.  History reviewed. No pertinent past medical history.  Past Surgical History:  Procedure Laterality Date  . CESAREAN SECTION  08/24/07    Family History  Problem Relation Age of Onset  . Diabetes Mother   . Heart disease Mother   . Immunodeficiency Mother        HIV- deceased at age 33  . Hypertension Mother   . Diabetes Father   . Heart disease Father   . Immunodeficiency Father        HIV- died last year at 2756  . Hypertension Father   . Varicose Veins Father   . Heart attack Father     Social History   Tobacco Use  . Smoking status: Never Smoker  . Smokeless tobacco: Never Used  Substance Use Topics  . Alcohol use: No    Alcohol/week: 6.0 oz    Types: 10 Shots of liquor per week    Frequency: Never  . Drug use: No    Allergies:  Allergies  Allergen Reactions  . Shellfish Allergy Swelling    Medications Prior to Admission  Medication Sig Dispense Refill Last Dose  . metroNIDAZOLE (FLAGYL) 500 MG tablet Take 1 tablet (500 mg total) by mouth 2 (two) times daily. 14 tablet 0 05/13/2017 at Unknown time    Review of Systems  Constitutional: Negative.   HENT: Negative.   Eyes: Negative.   Respiratory: Negative.   Cardiovascular: Negative.   Gastrointestinal: Positive for abdominal pain.  Endocrine: Negative.   Genitourinary: Positive for pelvic pain (cramping) and vaginal bleeding (intermittent heavy VB).  Musculoskeletal:  Negative.   Skin: Negative.   Allergic/Immunologic: Negative.   Neurological: Negative.   Hematological: Negative.   Psychiatric/Behavioral: Negative.    Physical Exam   Blood pressure 130/80, pulse 70, temperature 98.5 F (36.9 C), temperature source Oral, resp. rate 20, height 5\' 7"  (1.702 m), weight 158 lb 12 oz (72 kg), last menstrual period 04/19/2017, SpO2 100 %.  Physical Exam  Nursing note and vitals reviewed. Constitutional: She is oriented to person, place, and time. She appears well-developed and well-nourished.  HENT:  Head: Normocephalic.  Eyes: Pupils are equal, round, and reactive to light.  Neck: Normal range of motion.  Cardiovascular: Normal rate, regular rhythm and normal heart sounds.  Respiratory: Effort normal and breath sounds normal.  GI: Soft. Bowel sounds are normal.  Genitourinary:  Genitourinary Comments: Uterus: mildly tender, cx: smooth, pink, no lesions, small amt of dark, brownish-red blood in vaginal vault, closed/long/firm, no CMT or friability, mild bilateral adnexal tenderness   Musculoskeletal: Normal range of motion.  Neurological: She is alert and oriented to person, place, and time.  Skin: Skin is warm and dry.  Psychiatric: She has a normal mood and affect. Her behavior is normal. Judgment and thought content normal.    MAU Course  Procedures  MDM [facility] Pelvic exam  Results for orders placed or performed  during the hospital encounter of 05/13/17 (from the past 24 hour(s))  Urinalysis, Routine w reflex microscopic     Status: Abnormal   Collection Time: 05/13/17  2:37 PM  Result Value Ref Range   Color, Urine YELLOW YELLOW   APPearance CLEAR CLEAR   Specific Gravity, Urine 1.009 1.005 - 1.030   pH 7.0 5.0 - 8.0   Glucose, UA NEGATIVE NEGATIVE mg/dL   Hgb urine dipstick MODERATE (A) NEGATIVE   Bilirubin Urine NEGATIVE NEGATIVE   Ketones, ur NEGATIVE NEGATIVE mg/dL   Protein, ur NEGATIVE NEGATIVE mg/dL   Nitrite NEGATIVE NEGATIVE    Leukocytes, UA TRACE (A) NEGATIVE   RBC / HPF 0-5 0 - 5 RBC/hpf   WBC, UA 0-5 0 - 5 WBC/hpf   Bacteria, UA NONE SEEN NONE SEEN   Squamous Epithelial / LPF 6-30 (A) NONE SEEN    Report given to and care assumed by Dr. Adrian Blackwater @ 1750  Raelyn Mora, MSN, CNM 05/13/2017, 4:51 PM   Assessment and Plan

## 2017-05-13 NOTE — Progress Notes (Signed)
Talked to patient regarding bleeding. She states bleeding has increased to heavy like a period, passing clots, & the blood has a strong smell to it. Discussed with Ashley Rush & reviewed results, who states patient should go to MAU for further evaluation and ultrasound due to increased bleeding. Still concern for ectopic pregnancy. MAU notified.   Informed patient of results and recommendations. Patient verbalized understanding & was escorted upstairs.

## 2017-05-13 NOTE — Discharge Instructions (Signed)

## 2017-05-13 NOTE — MAU Note (Signed)
Pt sent to MAU from clinic.  States informed she may be having a miscarriage secondary decrease HCG levels.  Pt reports VB, lower abdominal cramping, and back pain.   States was informed she needed U/S.

## 2017-05-14 NOTE — Progress Notes (Signed)
Agree with nursing staff's documentation of this patient's clinic encounter.  Vonzella NippleJulie Wenzel, PA-C 05/14/2017 10:23 AM

## 2017-05-15 ENCOUNTER — Other Ambulatory Visit: Payer: Self-pay | Admitting: General Practice

## 2017-05-15 DIAGNOSIS — O3680X0 Pregnancy with inconclusive fetal viability, not applicable or unspecified: Secondary | ICD-10-CM

## 2017-06-11 ENCOUNTER — Encounter: Payer: Self-pay | Admitting: Physician Assistant

## 2017-06-11 ENCOUNTER — Other Ambulatory Visit: Payer: Self-pay

## 2017-06-11 ENCOUNTER — Ambulatory Visit (INDEPENDENT_AMBULATORY_CARE_PROVIDER_SITE_OTHER): Payer: 59 | Admitting: Physician Assistant

## 2017-06-11 VITALS — BP 116/72 | HR 81 | Temp 98.6°F | Resp 16 | Ht 68.0 in | Wt 155.4 lb

## 2017-06-11 DIAGNOSIS — Z113 Encounter for screening for infections with a predominantly sexual mode of transmission: Secondary | ICD-10-CM

## 2017-06-11 DIAGNOSIS — N898 Other specified noninflammatory disorders of vagina: Secondary | ICD-10-CM

## 2017-06-11 DIAGNOSIS — Z124 Encounter for screening for malignant neoplasm of cervix: Secondary | ICD-10-CM | POA: Diagnosis not present

## 2017-06-11 DIAGNOSIS — N760 Acute vaginitis: Secondary | ICD-10-CM

## 2017-06-11 DIAGNOSIS — Z01411 Encounter for gynecological examination (general) (routine) with abnormal findings: Secondary | ICD-10-CM | POA: Diagnosis not present

## 2017-06-11 LAB — POCT WET + KOH PREP
Trich by wet prep: ABSENT
Yeast by KOH: ABSENT
Yeast by wet prep: ABSENT

## 2017-06-11 MED ORDER — METRONIDAZOLE 0.75 % VA GEL: 1.0000 | Freq: Two times a day (BID) | VAGINAL | 0 refills | Status: DC

## 2017-06-11 MED FILL — metroNIDAZOLE 0.75 % GEL: 0.75 | 5 days supply | Qty: 70 | Fill #0

## 2017-06-11 NOTE — Patient Instructions (Addendum)
You are negative for BV, yeast and trichomonas.  You can start using Metrogel suppositories at night before bed. This can help the itching.  We will contact you with the lab results as soon as they come back.   [facility] out [facility] website. This is a great resource in comparing birth control options: effectiveness vs ease to use vs cost. Come back and see me when you have made your decision and we will get it started.   Thank you for coming in today. I hope you feel we met your needs.  Feel free to call PCP if you have any questions or further requests.  Please consider signing up for MyChart if you do not already have it, as this is a great way to communicate with me.  Best,  Whitney McVey, PA-C Health Maintenance, Female Adopting a healthy lifestyle and getting preventive care can go a long way to promote health and wellness. Talk with your health care provider about what schedule of regular examinations is right for you. This is a good chance for you to check in with your provider about disease prevention and staying healthy. In between checkups, there are plenty of things you can do on your own. Experts have done a lot of research about which lifestyle changes and preventive measures are most likely to keep you healthy. Ask your health care provider for more information. Weight and diet Eat a healthy diet  Be sure to include plenty of vegetables, fruits, low-fat dairy products, and lean protein.  Do not eat a lot of foods high in solid fats, added sugars, or salt.  Get regular exercise. This is one of the most important things you can do for your health. ? Most adults should exercise for at least 150 minutes each week. The exercise should increase your heart rate and make you sweat (moderate-intensity exercise). ? Most adults should also do strengthening exercises at least twice a week. This is in addition to the moderate-intensity exercise.  Maintain a healthy weight  Body  mass index (BMI) is a measurement that can be used to identify possible weight problems. It estimates body fat based on height and weight. Your health care provider can help determine your BMI and help you achieve or maintain a healthy weight.  For females 84 years of age and older: ? A BMI below 18.5 is considered underweight. ? A BMI of 18.5 to 24.9 is normal. ? A BMI of 25 to 29.9 is considered overweight. ? A BMI of 30 and above is considered obese.  Watch levels of cholesterol and blood lipids  You should start having your blood tested for lipids and cholesterol at 33 years of age, then have this test every 5 years.  You may need to have your cholesterol levels checked more often if: ? Your lipid or cholesterol levels are high. ? You are older than 33 years of age. ? You are at high risk for heart disease.  Cancer screening Lung Cancer  Lung cancer screening is recommended for adults 64-46 years old who are at high risk for lung cancer because of a history of smoking.  A yearly low-dose CT scan of the lungs is recommended for people who: ? Currently smoke. ? Have quit within the past 15 years. ? Have at least a 30-pack-year history of smoking. A pack year is smoking an average of one pack of cigarettes a day for 1 year.  Yearly screening should continue until it has been 15 years  since you quit.  Yearly screening should stop if you develop a health problem that would prevent you from having lung cancer treatment.  Breast Cancer  Practice breast self-awareness. This means understanding how your breasts normally appear and feel.  It also means doing regular breast self-exams. Let your health care provider know about any changes, no matter how small.  If you are in your 20s or 30s, you should have a clinical breast exam (CBE) by a health care provider every 1-3 years as part of a regular health exam.  If you are 54 or older, have a CBE every year. Also consider having a  breast X-ray (mammogram) every year.  If you have a family history of breast cancer, talk to your health care provider about genetic screening.  If you are at high risk for breast cancer, talk to your health care provider about having an MRI and a mammogram every year.  Breast cancer gene (BRCA) assessment is recommended for women who have family members with BRCA-related cancers. BRCA-related cancers include: ? Breast. ? Ovarian. ? Tubal. ? Peritoneal cancers.  Results of the assessment will determine the need for genetic counseling and BRCA1 and BRCA2 testing.  Cervical Cancer Your health care provider may recommend that you be screened regularly for cancer of the pelvic organs (ovaries, uterus, and vagina). This screening involves a pelvic examination, including checking for microscopic changes to the surface of your cervix (Pap test). You may be encouraged to have this screening done every 3 years, beginning at age 34.  For women ages 72-65, health care providers may recommend pelvic exams and Pap testing every 3 years, or they may recommend the Pap and pelvic exam, combined with testing for human papilloma virus (HPV), every 5 years. Some types of HPV increase your risk of cervical cancer. Testing for HPV may also be done on women of any age with unclear Pap test results.  Other health care providers may not recommend any screening for nonpregnant women who are considered low risk for pelvic cancer and who do not have symptoms. Ask your health care provider if a screening pelvic exam is right for you.  If you have had past treatment for cervical cancer or a condition that could lead to cancer, you need Pap tests and screening for cancer for at least 20 years after your treatment. If Pap tests have been discontinued, your risk factors (such as having a new sexual partner) need to be reassessed to determine if screening should resume. Some women have medical problems that increase the chance  of getting cervical cancer. In these cases, your health care provider may recommend more frequent screening and Pap tests.  Colorectal Cancer  This type of cancer can be detected and often prevented.  Routine colorectal cancer screening usually begins at 33 years of age and continues through 33 years of age.  Your health care provider may recommend screening at an earlier age if you have risk factors for colon cancer.  Your health care provider may also recommend using home test kits to check for hidden blood in the stool.  A small camera at the end of a tube can be used to examine your colon directly (sigmoidoscopy or colonoscopy). This is done to check for the earliest forms of colorectal cancer.  Routine screening usually begins at age 35.  Direct examination of the colon should be repeated every 5-10 years through 33 years of age. However, you may need to be screened more often if  early forms of precancerous polyps or small growths are found.  Skin Cancer  Check your skin from head to toe regularly.  Tell your health care provider about any new moles or changes in moles, especially if there is a change in a mole's shape or color.  Also tell your health care provider if you have a mole that is larger than the size of a pencil eraser.  Always use sunscreen. Apply sunscreen liberally and repeatedly throughout the day.  Protect yourself by wearing long sleeves, pants, a wide-brimmed hat, and sunglasses whenever you are outside.  Heart disease, diabetes, and high blood pressure  High blood pressure causes heart disease and increases the risk of stroke. High blood pressure is more likely to develop in: ? People who have blood pressure in the high end of the normal range (130-139/85-89 mm Hg). ? People who are overweight or obese. ? People who are African American.  If you are 39-83 years of age, have your blood pressure checked every 3-5 years. If you are 89 years of age or older,  have your blood pressure checked every year. You should have your blood pressure measured twice-once when you are at a hospital or clinic, and once when you are not at a hospital or clinic. Record the average of the two measurements. To check your blood pressure when you are not at a hospital or clinic, you can use: ? An automated blood pressure machine at a pharmacy. ? A home blood pressure monitor.  If you are between 40 years and 78 years old, ask your health care provider if you should take aspirin to prevent strokes.  Have regular diabetes screenings. This involves taking a blood sample to check your fasting blood sugar level. ? If you are at a normal weight and have a low risk for diabetes, have this test once every three years after 33 years of age. ? If you are overweight and have a high risk for diabetes, consider being tested at a younger age or more often. Preventing infection Hepatitis B  If you have a higher risk for hepatitis B, you should be screened for this virus. You are considered at high risk for hepatitis B if: ? You were born in a country where hepatitis B is common. Ask your health care provider which countries are considered high risk. ? Your parents were born in a high-risk country, and you have not been immunized against hepatitis B (hepatitis B vaccine). ? You have HIV or AIDS. ? You use needles to inject street drugs. ? You live with someone who has hepatitis B. ? You have had sex with someone who has hepatitis B. ? You get hemodialysis treatment. ? You take certain medicines for conditions, including cancer, organ transplantation, and autoimmune conditions.  Hepatitis C  Blood testing is recommended for: ? Everyone born from [birth year] through 1966. ? Anyone with known risk factors for hepatitis C.  Sexually transmitted infections (STIs)  You should be screened for sexually transmitted infections (STIs) including gonorrhea and chlamydia if: ? You are sexually  active and are younger than 33 years of age. ? You are older than 33 years of age and your health care provider tells you that you are at risk for this type of infection. ? Your sexual activity has changed since you were last screened and you are at an increased risk for chlamydia or gonorrhea. Ask your health care provider if you are at risk.  If you do not have  HIV, but are at risk, it may be recommended that you take a prescription medicine daily to prevent HIV infection. This is called pre-exposure prophylaxis (PrEP). You are considered at risk if: ? You are sexually active and do not regularly use condoms or know the HIV status of your partner(s). ? You take drugs by injection. ? You are sexually active with a partner who has HIV.  Talk with your health care provider about whether you are at high risk of being infected with HIV. If you choose to begin PrEP, you should first be tested for HIV. You should then be tested every 3 months for as long as you are taking PrEP. Pregnancy  If you are premenopausal and you may become pregnant, ask your health care provider about preconception counseling.  If you may become pregnant, take 400 to 800 micrograms (mcg) of folic acid every day.  If you want to prevent pregnancy, talk to your health care provider about birth control (contraception). Osteoporosis and menopause  Osteoporosis is a disease in which the bones lose minerals and strength with aging. This can result in serious bone fractures. Your risk for osteoporosis can be identified using a bone density scan.  If you are 58 years of age or older, or if you are at risk for osteoporosis and fractures, ask your health care provider if you should be screened.  Ask your health care provider whether you should take a calcium or vitamin D supplement to lower your risk for osteoporosis.  Menopause may have certain physical symptoms and risks.  Hormone replacement therapy may reduce some of these  symptoms and risks. Talk to your health care provider about whether hormone replacement therapy is right for you. Follow these instructions at home:  Schedule regular health, dental, and eye exams.  Stay current with your immunizations.  Do not use any tobacco products including cigarettes, chewing tobacco, or electronic cigarettes.  If you are pregnant, do not drink alcohol.  If you are breastfeeding, limit how much and how often you drink alcohol.  Limit alcohol intake to no more than 1 drink per day for nonpregnant women. One drink equals 12 ounces of beer, 5 ounces of wine, or 1 ounces of hard liquor.  Do not use street drugs.  Do not share needles.  Ask your health care provider for help if you need support or information about quitting drugs.  Tell your health care provider if you often feel depressed.  Tell your health care provider if you have ever been abused or do not feel safe at home. This information is not intended to replace advice given to you by your health care provider. Make sure you discuss any questions you have with your health care provider. Document Released: 11/05/2010 Document Revised: 09/28/2015 Document Reviewed: 01/24/2015 Elsevier Interactive Patient Education  2018 [employer].     IF you received an x-ray today, you will receive an invoice from [facility]. Please contact [facility] at [phone] with questions or concerns regarding your invoice.   IF you received labwork today, you will receive an invoice from [facility]. Please contact [facility] at [phone] with questions or concerns regarding your invoice.   Our billing staff will not be able to assist you with questions regarding bills from these companies.  You will be contacted with the lab results as soon as they are available. The fastest way to get your results is to activate your My Chart account. Instructions are located on the last page of  this paperwork. If  you have not heard from [place] regarding the results in 2 weeks, please contact this office.

## 2017-06-11 NOTE — Progress Notes (Signed)
Ashley Rush  MRN: [id number] DOB: 03/28/1986  PCP: Cain SaupeFulp, Cammie, MD (Inactive)  Subjective:  Pt is a 32 year old female who presents to clinic for vaginal itching x 5 days.  She denies pain, discharge, back pain, flank pain, pain with intercourse, fever, chills.  One partner only. No condoms. Not on birth control (she has tried IUD and OCPs).  H/o recent SAB.She was taking antibiotics last month.  [facility].  She is not UTD on her PAP. Last PAP was 2014. She has never had abnormal PAP.   She used monistat three days ago.   Review of Systems  Constitutional: Negative for chills, fatigue and fever.  Respiratory: Negative for cough, shortness of breath and wheezing.   Cardiovascular: Negative for chest pain and palpitations.  Gastrointestinal: Negative for abdominal pain, diarrhea, nausea and vomiting.  Genitourinary: Positive for vaginal pain (itching). Negative for decreased urine volume, difficulty urinating, dysuria, enuresis, flank pain, frequency, hematuria and urgency.  Musculoskeletal: Negative for back pain.  Neurological: Negative for dizziness, weakness, light-headedness and headaches.    Patient Active Problem List   Diagnosis Date Noted  . Abnormal human chorionic gonadotropin (hCG) 05/13/2017  . Pregnancy of unknown anatomic location 05/13/2017  . Edema 04/23/2013  . Lymphedema 04/23/2013  . General medical examination 03/04/2011  . Spotting between menses 12/21/2010  . Screening for STDs (sexually transmitted diseases) 12/11/2010  . Screening for cholesterol level 12/11/2010  . Screening for diabetes mellitus 12/11/2010  . Acne 09/07/2010    No current outpatient medications on file prior to visit.   No current facility-administered medications on file prior to visit.     Allergies  Allergen Reactions  . Shellfish Allergy Swelling     Objective:  BP 116/72   Pulse 81   Temp 98.6 F (37 C) (Oral)   Resp 16   Ht 5'  8" (1.727 m)   Wt 155 lb 6.4 oz (70.5 kg)   LMP 04/19/2017 (Approximate)   SpO2 97%   BMI 23.63 kg/m   Physical Exam  Constitutional: She is oriented to person, place, and time and well-developed, well-nourished, and in no distress. No distress.  Cardiovascular: Normal rate, regular rhythm and normal heart sounds.  Genitourinary: Uterus normal, cervix normal, right adnexa normal, left adnexa normal and vulva normal. Vagina exhibits normal mucosa. Watery  thick  curdy  yellow and vaginal discharge found.  Neurological: She is alert and oriented to person, place, and time. GCS score is 15.  Skin: Skin is warm and dry.  Psychiatric: Mood, memory, affect and judgment normal.  Vitals reviewed.   Results for orders placed or performed in visit on 06/11/17  POCT Wet + KOH Prep  Result Value Ref Range   Yeast by KOH Absent Absent   Yeast by wet prep Absent Absent   WBC by wet prep Too numerous to count  (A) Few   Clue Cells Wet Prep HPF POC None None   Trich by wet prep Absent Absent   Bacteria Wet Prep HPF POC Too numerous to count  (A) Few   Epithelial Cells By [employer] Financial[employer]et Pref ([facility]) Few None, Few, Too numerous to count   RBC,UR,HPF,POC None None RBC/hpf    Assessment and Plan :  1. Vagina itching 2. Encounter for gynecological examination with abnormal finding - POCT Wet + KOH Prep - Pt presents for vaginal itching x 5 days. Wet prep is negative for BV, yeast and trich. Will treat with Metrogel  at this time. Plan to await results, suspect GC/chlamydia. PAP done today. Pt will consider birth control options and RTC to start.  3. Acute vaginitis - metroNIDAZOLE (METROGEL VAGINAL) 0.75 % vaginal gel; Place 1 Applicatorful vaginally 2 (two) times daily.  Dispense: 70 g; Refill: 0  4. Screening for cervical cancer - Pap IG, CT/NG NAA, and HPV (high risk)  5. Screen for STD (sexually transmitted disease) - Hepatitis C antibody - HIV antibody - RPR   Marco Collie, PA-C  Primary Care at  [facility] 06/11/2017 10:43 AM

## 2017-06-12 LAB — HIV ANTIBODY (ROUTINE TESTING W REFLEX): HIV Screen 4th Generation wRfx: NONREACTIVE

## 2017-06-12 LAB — HEPATITIS C ANTIBODY: Hep C Virus Ab: <0.1 s/co ratio (ref 0.0–0.9)

## 2017-06-12 LAB — RPR: RPR Ser Ql: NONREACTIVE

## 2017-06-13 LAB — PAP IG, CT-NG NAA, HPV HIGH-RISK
Chlamydia, Nuc. Acid Amp: NEGATIVE
Gonococcus by Nucleic Acid Amp: NEGATIVE
HPV, high-risk: NEGATIVE
PAP Smear Comment: 0

## 2017-06-14 ENCOUNTER — Encounter: Payer: Self-pay | Admitting: Physician Assistant

## 2017-06-14 NOTE — Progress Notes (Signed)
Letter sent in mail with results. PAP negative. Next PAP due 05/2020

## 2017-09-20 ENCOUNTER — Encounter: Payer: Self-pay | Admitting: Urgent Care

## 2017-09-20 ENCOUNTER — Other Ambulatory Visit: Payer: Self-pay

## 2017-09-20 ENCOUNTER — Ambulatory Visit (INDEPENDENT_AMBULATORY_CARE_PROVIDER_SITE_OTHER): Payer: 59 | Admitting: Urgent Care

## 2017-09-20 VITALS — BP 114/66 | HR 83 | Temp 98.2°F | Resp 18 | Ht 68.0 in | Wt 155.4 lb

## 2017-09-20 DIAGNOSIS — Z7251 High risk heterosexual behavior: Secondary | ICD-10-CM

## 2017-09-20 DIAGNOSIS — N898 Other specified noninflammatory disorders of vagina: Secondary | ICD-10-CM | POA: Diagnosis not present

## 2017-09-20 LAB — POCT URINALYSIS DIP (MANUAL ENTRY)
Bilirubin, UA: NEGATIVE
GLUCOSE UA: NEGATIVE mg/dL
Ketones, POC UA: NEGATIVE mg/dL
Nitrite, UA: POSITIVE — AB
Protein Ur, POC: NEGATIVE mg/dL
RBC UA: NEGATIVE
SPEC GRAV UA: 1.02 (ref 1.010–1.025)
UROBILINOGEN UA: 1 U/dL
pH, UA: 6 (ref 5.0–8.0)

## 2017-09-20 MED ORDER — CEFTRIAXONE SODIUM 250 MG IJ SOLR
250.0000 mg | Freq: Once | INTRAMUSCULAR | Status: AC
  Administered 2017-09-20: 250 mg via INTRAMUSCULAR

## 2017-09-20 MED ORDER — AZITHROMYCIN 500 MG PO TABS: 1000.0000 mg | ORAL_TABLET | Freq: Once | ORAL | 0 refills | Status: AC

## 2017-09-20 MED ORDER — FLUCONAZOLE 150 MG PO TABS: 150.0000 mg | ORAL_TABLET | ORAL | 0 refills | Status: DC

## 2017-09-20 MED ORDER — NITROFURANTOIN MONOHYD MACRO 100 MG PO CAPS: 100.0000 mg | ORAL_CAPSULE | Freq: Two times a day (BID) | ORAL | 0 refills | Status: DC

## 2017-09-20 NOTE — Patient Instructions (Addendum)
Vaginitis Vaginitis is a condition in which the vaginal tissue swells and becomes red (inflamed). This condition is most often caused by a change in the normal balance of bacteria and yeast that live in the vagina. This change causes an overgrowth of certain bacteria or yeast, which causes the inflammation. There are different types of vaginitis, but the most common types are:  Bacterial vaginosis.  Yeast infection (candidiasis).  Trichomoniasis vaginitis. This is a sexually transmitted disease (STD).  Viral vaginitis.  Atrophic vaginitis.  Allergic vaginitis.  What are the causes? The cause of this condition depends on the type of vaginitis. It can be caused by:  Bacteria (bacterial vaginosis).  Yeast, which is a fungus (yeast infection).  A parasite (trichomoniasis vaginitis).  A virus (viral vaginitis).  Low hormone levels (atrophic vaginitis). Low hormone levels can occur during pregnancy, breastfeeding, or after menopause.  Irritants, such as bubble baths, scented tampons, and feminine sprays (allergic vaginitis).  Other factors can change the normal balance of the yeast and bacteria that live in the vagina. These include:  Antibiotic medicines.  Poor hygiene.  Diaphragms, vaginal sponges, spermicides, birth control pills, and intrauterine devices (IUD).  Sex.  Infection.  Uncontrolled diabetes.  A weakened defense (immune) system.  What increases the risk? This condition is more likely to develop in women who:  Smoke.  Use vaginal douches, scented tampons, or scented sanitary pads.  Wear tight-fitting pants.  Wear thong underwear.  Use oral birth control pills or an IUD.  Have sex without a condom.  Have multiple sex partners.  Have an STD.  Frequently use the spermicide nonoxynol-9.  Eat lots of foods high in sugar.  Have uncontrolled diabetes.  Have low estrogen levels.  Have a weakened immune system from an immune disorder or medical  treatment.  Are pregnant or breastfeeding.  What are the signs or symptoms? Symptoms vary depending on the cause of the vaginitis. Common symptoms include:  Abnormal vaginal discharge. ? The discharge is white, gray, or yellow with bacterial vaginosis. ? The discharge is thick, white, and cheesy with a yeast infection. ? The discharge is frothy and yellow or greenish with trichomoniasis.  A bad vaginal smell. The smell is fishy with bacterial vaginosis.  Vaginal itching, pain, or swelling.  Sex that is painful.  Pain or burning when urinating.  Sometimes there are no symptoms. How is this diagnosed? This condition is diagnosed based on your symptoms and medical history. A physical exam, including a pelvic exam, will also be done. You may also have other tests, including:  Tests to determine the pH level (acidity or alkalinity) of your vagina.  A whiff test, to assess the odor that results when a sample of your vaginal discharge is mixed with a potassium hydroxide solution.  Tests of vaginal fluid. A sample will be examined under a microscope.  How is this treated? Treatment varies depending on the type of vaginitis you have. Your treatment may include:  Antibiotic creams or pills to treat bacterial vaginosis and trichomoniasis.  Antifungal medicines, such as vaginal creams or suppositories, to treat a yeast infection.  Medicine to ease discomfort if you have viral vaginitis. Your sexual partner should also be treated.  Estrogen delivered in a cream, pill, suppository, or vaginal ring to treat atrophic vaginitis. If vaginal dryness occurs, lubricants and moisturizing creams may help. You may need to avoid scented soaps, sprays, or douches.  Stopping use of a product that is causing allergic vaginitis. Then using a vaginal  cream to treat the symptoms.  Follow these instructions at home: Lifestyle  Keep your genital area clean and dry. Avoid soap, and only rinse the area  with water.  Do not douche or use tampons until your health care provider says it is okay to do so. Use sanitary pads, if needed.  Do not have sex until your health care provider approves. When you can return to sex, practice safe sex and use condoms.  Wipe from front to back. This avoids the spread of bacteria from the rectum to the vagina. General instructions  Take over-the-counter and prescription medicines only as told by your health care provider.  If you were prescribed an antibiotic medicine, take or use it as told by your health care provider. Do not stop taking or using the antibiotic even if you start to feel better.  Keep all follow-up visits as told by your health care provider. This is important. How is this prevented?  Use mild, non-scented products. Do not use things that can irritate the vagina, such as fabric softeners. Avoid the following products if they are scented: ? Feminine sprays. ? Detergents. ? Tampons. ? Feminine hygiene products. ? Soaps or bubble baths.  Let air reach your genital area. ? Wear cotton underwear to reduce moisture buildup. ? Avoid wearing underwear while you sleep. ? Avoid wearing tight pants and underwear or nylons without a cotton panel. ? Avoid wearing thong underwear.  Take off any wet clothing, such as bathing suits, as soon as possible.  Practice safe sex and use condoms. Contact a health care provider if:  You have abdominal pain.  You have a fever.  You have symptoms that last for more than 2-3 days. Get help right away if:  You have a fever and your symptoms suddenly get worse. Summary  Vaginitis is a condition in which the vaginal tissue becomes inflamed.This condition is most often caused by a change in the normal balance of bacteria and yeast that live in the vagina.  Treatment varies depending on the type of vaginitis you have.  Do not douche, use tampons , or have sex until your health care provider approves.  When you can return to sex, practice safe sex and use condoms. This information is not intended to replace advice given to you by your health care provider. Make sure you discuss any questions you have with your health care provider. Document Released: 02/17/2007 Document Revised: 05/28/2016 Document Reviewed: 05/28/2016 Elsevier Interactive Patient Education  2018 [employer]     IF you received an x-ray today, you will receive an invoice from [facility]. Please contact  Radiology at [phone] with questions or concerns regarding your invoice.   IF you received labwork today, you will receive an invoice from [facility]. Please contact [facility] at [phone] with questions or concerns regarding your invoice.   Our billing staff will not be able to assist you with questions regarding bills from these companies.  You will be contacted with the lab results as soon as they are available. The fastest way to get your results is to activate your My Chart account. Instructions are located on the last page of this paperwork. If you have not heard from us regarding the results in 2 weeks, please contact this office.     

## 2017-09-20 NOTE — Addendum Note (Signed)
Addended by: Wallis Bamberg on: 09/20/2017 04:04 PM   Modules accepted: Orders

## 2017-09-20 NOTE — Progress Notes (Addendum)
    MRN: [id number] DOB: May 10, 1985  Subjective:   Ashley Rush is a 32 y.o. female presenting for 4 day history of malodorous vaginal discharge. Patient is currently on her cycle, is near its end but is still spotting.  Denies fever, nausea, vomiting, belly pain, dysuria, hematuria, urinary frequency, genital rash.  Patient admits that she does get yeast infections with antibiotic use.  He is sexually active, does not use condoms for protection.  Ashley Rush has a current medication list which includes the following prescription(s): metronidazole. Also is allergic to shellfish allergy.  Ashley Rush  has a past medical history of Miscarriage. Also  has a past surgical history that includes Cesarean section (08/24/07).  Objective:   Vitals: Pulse 83   Temp 98.2 F (36.8 C) (Oral)   Resp 18   Ht  (1.727 m)   Wt 155 lb 6.4 oz (70.5 kg)   LMP 09/08/2017   SpO2 98%   Breastfeeding? Unknown   BMI 23.63 kg/m   Physical Exam  Constitutional: She is oriented to person, place, and time. She appears well-developed and well-nourished.  Cardiovascular: Normal rate.  Pulmonary/Chest: Effort normal.  Neurological: She is alert and oriented to person, place, and time.   Results for orders placed or performed in visit on 09/20/17 (from the past 24 hour(s))  POCT urinalysis dipstick     Status: Abnormal   Collection Time: 09/20/17  4:04 PM  Result Value Ref Range   Color, UA yellow yellow   Clarity, UA cloudy (A) clear   Glucose, UA negative negative mg/dL   Bilirubin, UA negative negative   Ketones, POC UA negative negative mg/dL   Spec Grav, UA 3.086 5.784 - 1.025   Blood, UA negative negative   pH, UA 6.0 5.0 - 8.0   Protein Ur, POC negative negative mg/dL   Urobilinogen, UA 1.0 0.2 or 1.0 E.U./dL   Nitrite, UA Positive (A) Negative   Leukocytes, UA Small (1+) (A) Negative     Assessment and Plan :   Vaginal discharge - Plan: GC/Chlamydia Probe Amp([facility]), Trichomonas vaginalis,  RNA, cefTRIAXone (ROCEPHIN) injection 250 mg, Wet Prep for Trick, Yeast, Clue  Unprotected sex - Plan: cefTRIAXone (ROCEPHIN) injection 250 mg  Discussed treatment options with patient today, she is agreeable to empiric treatment for gonorrhea and chlamydia with ceftriaxone and azithromycin.  I will cover her for anabiotic associated yeast infection with Diflucan.  Labs pending, will treat further based off of results.  Otherwise follow-up as needed.  Wallis Bamberg, PA-C [facility] [phone] 09/20/2017  3:04 PM  UPDATE: Patient left prior to receiving her IM ceftriaxone.  I called her and had a return to clinic to make sure she received treatment.  Medical assistant Ashley Rush notified me that she had run a urine dipstick that resulted as above.  I discussed this finding with patient and we agreed to run a urine culture and treat her for a cystitis.  Counseled that I am not entirely convinced this is a cystitis given that she has no urinary symptoms the patient insisted on being treated.  We will follow-up with lab results.

## 2017-09-20 NOTE — Addendum Note (Signed)
Addended by: Wallis Bamberg on: 09/20/2017 04:23 PM   Modules accepted: Orders

## 2017-09-20 NOTE — Addendum Note (Signed)
Addended by: Wallis Bamberg on: 09/20/2017 03:35 PM   Modules accepted: Orders

## 2017-09-21 LAB — RPR: RPR Ser Ql: NONREACTIVE

## 2017-09-22 ENCOUNTER — Other Ambulatory Visit: Payer: Self-pay | Admitting: Urgent Care

## 2017-09-22 LAB — WET PREP FOR TRICH, YEAST, CLUE
CLUE CELL EXAM: POSITIVE — AB
Trichomonas Exam: NEGATIVE
Yeast Exam: NEGATIVE

## 2017-09-22 LAB — GC/CHLAMYDIA PROBE AMP
Chlamydia trachomatis, NAA: NEGATIVE
NEISSERIA GONORRHOEAE BY PCR: NEGATIVE

## 2017-09-22 LAB — TRICHOMONAS VAGINALIS, PROBE AMP: Trich vag by NAA: NEGATIVE

## 2017-09-22 LAB — URINE CULTURE

## 2017-09-22 MED ORDER — METRONIDAZOLE 500 MG PO TABS: 500.0000 mg | ORAL_TABLET | Freq: Two times a day (BID) | ORAL | 0 refills | Status: DC

## 2017-11-03 DIAGNOSIS — Z0000 Encounter for general adult medical examination without abnormal findings: Secondary | ICD-10-CM | POA: Diagnosis not present

## 2018-05-03 DIAGNOSIS — H5203 Hypermetropia, bilateral: Secondary | ICD-10-CM | POA: Diagnosis not present

## 2018-06-03 ENCOUNTER — Ambulatory Visit (INDEPENDENT_AMBULATORY_CARE_PROVIDER_SITE_OTHER): Payer: Self-pay | Admitting: Nurse Practitioner

## 2018-06-03 VITALS — BP 105/65 | HR 95 | Temp 99.2°F | Resp 18 | Wt 156.0 lb

## 2018-06-03 DIAGNOSIS — J111 Influenza due to unidentified influenza virus with other respiratory manifestations: Secondary | ICD-10-CM

## 2018-06-03 MED ORDER — PROMETHAZINE-DM 6.25-15 MG/5ML PO SYRP: 5.0000 mL | ORAL_SOLUTION | Freq: Four times a day (QID) | ORAL | 0 refills | Status: AC | PRN

## 2018-06-03 MED ORDER — OSELTAMIVIR PHOSPHATE 75 MG PO CAPS: 75.0000 mg | ORAL_CAPSULE | Freq: Two times a day (BID) | ORAL | 0 refills | Status: AC

## 2018-06-03 MED ORDER — PSEUDOEPH-BROMPHEN-DM 30-2-10 MG/5ML PO SYRP: 5.0000 mL | ORAL_SOLUTION | Freq: Four times a day (QID) | ORAL | 0 refills | Status: AC | PRN

## 2018-06-03 MED FILL — OSELTAMIVIR PHOSPHATE 75 MG: 75 | 5 days supply | Qty: 10 | Fill #0

## 2018-06-03 MED FILL — BROMPHENIR-PSEUDOEPHED-DM S: 30-2-10 | 7 days supply | Qty: 150 | Fill #0

## 2018-06-03 MED FILL — PROMETHAZINE W/DM SYRUP: 6.25-15 | 7 days supply | Qty: 140 | Fill #0

## 2018-06-03 NOTE — Patient Instructions (Signed)
Influenza, Adult -Take medication as prescribed. -Ibuprofen 800 mg every 8 hours for the next 2 days.  Take this medication with food and water to protect the stomach lining. -Increase fluids. -Sleep elevated on at least 2 pillows at bedtime to help with cough. -Use a humidifier or vaporizer when at home and during sleep to help with cough. -May use a teaspoon of honey or over-the-counter cough drops to help with cough. -Remain home until fever-free for at least 24-48 hours. -Follow-up if symptoms do not improve. -Work note was provided for patient to return to work on 06/05/2018.  Influenza, more commonly known as "the flu," is a viral infection that mainly affects the respiratory tract. The respiratory tract includes organs that help you breathe, such as the lungs, nose, and throat. The flu causes many symptoms similar to the common cold along with high fever and body aches. The flu spreads easily from person to person (is contagious). Getting a flu shot (influenza vaccination) every year is the best way to prevent the flu. What are the causes? This condition is caused by the influenza virus. You can get the virus by:  Breathing in droplets that are in the air from an infected person's cough or sneeze.  Touching something that has been exposed to the virus (has been contaminated) and then touching your mouth, nose, or eyes. What increases the risk? The following factors may make you more likely to get the flu:  Not washing or sanitizing your hands often.  Having close contact with many people during cold and flu season.  Touching your mouth, eyes, or nose without first washing or sanitizing your hands.  Not getting a yearly (annual) flu shot. You may have a higher risk for the flu, including serious problems such as a lung infection (pneumonia), if you:  Are older than 65.  Are pregnant.  Have a weakened disease-fighting system (immune system). You may have a weakened immune  system if you: ? Have HIV or AIDS. ? Are undergoing chemotherapy. ? Are taking medicines that reduce (suppress) the activity of your immune system.  Have a long-term (chronic) illness, such as heart disease, kidney disease, diabetes, or lung disease.  Have a liver disorder.  Are severely overweight (morbidly obese).  Have anemia. This is a condition that affects your red blood cells.  Have asthma. What are the signs or symptoms? Symptoms of this condition usually begin suddenly and last 4-14 days. They may include:  Fever and chills.  Headaches, body aches, or muscle aches.  Sore throat.  Cough.  Runny or stuffy (congested) nose.  Chest discomfort.  Poor appetite.  Weakness or fatigue.  Dizziness.  Nausea or vomiting. How is this diagnosed? This condition may be diagnosed based on:  Your symptoms and medical history.  A physical exam.  Swabbing your nose or throat and testing the fluid for the influenza virus. How is this treated? If the flu is diagnosed early, you can be treated with medicine that can help reduce how severe the illness is and how long it lasts (antiviral medicine). This may be given by mouth (orally) or through an IV. Taking care of yourself at home can help relieve symptoms. Your health care provider may recommend:  Taking over-the-counter medicines.  Drinking plenty of fluids. In many cases, the flu goes away on its own. If you have severe symptoms or complications, you may be treated in a hospital. Follow these instructions at home: Activity  Rest as needed and get plenty  of sleep.  Stay home from work or school as told by your health care provider. Unless you are visiting your health care provider, avoid leaving home until your fever has been gone for 24 hours without taking medicine. Eating and drinking  Take an oral rehydration solution (ORS). This is a drink that is sold at pharmacies and retail stores.  Drink enough fluid to keep  your urine pale yellow.  Drink clear fluids in small amounts as you are able. Clear fluids include water, ice chips, diluted fruit juice, and low-calorie sports drinks.  Eat bland, easy-to-digest foods in small amounts as you are able. These foods include bananas, applesauce, rice, lean meats, toast, and crackers.  Avoid drinking fluids that contain a lot of sugar or caffeine, such as energy drinks, regular sports drinks, and soda.  Avoid alcohol.  Avoid spicy or fatty foods. General instructions      Take over-the-counter and prescription medicines only as told by your health care provider.  Use a cool mist humidifier to add humidity to the air in your home. This can make it easier to breathe.  Cover your mouth and nose when you cough or sneeze.  Wash your hands with soap and water often, especially after you cough or sneeze. If soap and water are not available, use alcohol-based hand sanitizer.  Keep all follow-up visits as told by your health care provider. This is important. How is this prevented?   Get an annual flu shot. You may get the flu shot in late summer, fall, or winter. Ask your health care provider when you should get your flu shot.  Avoid contact with people who are sick during cold and flu season. This is generally fall and winter. Contact a health care provider if:  You develop new symptoms.  You have: ? Chest pain. ? Diarrhea. ? A fever.  Your cough gets worse.  You produce more mucus.  You feel nauseous or you vomit. Get help right away if:  You develop shortness of breath or difficulty breathing.  Your skin or nails turn a bluish color.  You have severe pain or stiffness in your neck.  You develop a sudden headache or sudden pain in your face or ear.  You cannot eat or drink without vomiting. Summary  Influenza, more commonly known as "the flu," is a viral infection that primarily affects your respiratory tract.  Symptoms of the flu  usually begin suddenly and last 4-14 days.  Getting an annual flu shot is the best way to prevent getting the flu.  Stay home from work or school as told by your health care provider. Unless you are visiting your health care provider, avoid leaving home until your fever has been gone for 24 hours without taking medicine.  Keep all follow-up visits as told by your health care provider. This is important. This information is not intended to replace advice given to you by your health care provider. Make sure you discuss any questions you have with your health care provider. Document Released: 04/19/2000 Document Revised: 10/08/2017 Document Reviewed: 10/08/2017 Elsevier Interactive Patient Education  2019 [employer].

## 2018-06-03 NOTE — Progress Notes (Addendum)
Subjective:     Ashley Rush is a 33 y.o. female who presents for evaluation of influenza like symptoms. Symptoms include fevers up to 101.8 degrees and hot and cold spells, chills, headache, myalgias, productive cough, sore throat and fatigue and have been present for 2 days. She has tried to alleviate the symptoms with OTC Cough and Cold medicines with minimal relief. High risk factors for influenza complications: none.  Patient informs that she has received the influenza vaccine this season.  The patient states "I just cannot get comfortable".  The following portions of the patient's history were reviewed and updated as appropriate: allergies, current medications and past medical history.  Review of Systems Constitutional: positive for anorexia, chills, fatigue, fevers and malaise, negative for weight loss Eyes: negative Ears, nose, mouth, throat, and face: positive for sore throat and right ear fullness/pressure, negative for ear drainage, earaches and nasal congestion Respiratory: positive for cough and sputum, negative for asthma, chronic bronchitis, dyspnea on exertion, pneumonia, stridor and wheezing Cardiovascular: negative Gastrointestinal: positive for decreased appetite, negative for abdominal pain, constipation, diarrhea, nausea and vomiting Neurological: positive for headaches, negative for coordination problems, dizziness, gait problems, paresthesia, tremors, vertigo and weakness     Objective:    BP 105/65 (BP Location: Right Arm, Patient Position: Sitting, Cuff Size: Normal)   Pulse (!) 110   Temp 99.2 F (37.3 C) (Oral)   Resp 18   Wt 156 lb (70.8 kg)   SpO2 96%   BMI 23.72 kg/m    Physical Exam Vitals signs reviewed.  Constitutional:      General: She is not in acute distress.    Comments: Appears uncomfortable  HENT:     Head: Normocephalic.     Right Ear: Ear canal normal.     Left Ear: Tympanic membrane normal.     Ears:     Comments: Right middle ear  effusion    Nose: Congestion and rhinorrhea present.     Mouth/Throat:     Mouth: Mucous membranes are moist. No oral lesions.     Pharynx: Uvula midline. Posterior oropharyngeal erythema present. No pharyngeal swelling, oropharyngeal exudate or uvula swelling.     Tonsils: No tonsillar exudate. Swelling: 0 on the right. 0 on the left.  Neck:     Musculoskeletal: Normal range of motion and neck supple.  Cardiovascular:     Rate and Rhythm: Regular rhythm. Tachycardia present.     Heart sounds: Normal heart sounds.  Pulmonary:     Effort: Pulmonary effort is normal. No respiratory distress.     Breath sounds: Normal breath sounds. No wheezing or rales.  Abdominal:     General: There is no distension.     Palpations: Abdomen is soft.     Tenderness: There is no abdominal tenderness.  Lymphadenopathy:     Cervical: No cervical adenopathy.  Skin:    General: Skin is warm and dry.     Capillary Refill: Capillary refill takes less than 2 seconds.  Neurological:     General: No focal deficit present.     Mental Status: She is alert and oriented to person, place, and time.  Psychiatric:        Mood and Affect: Mood normal.        Behavior: Behavior normal.    Rechecked patient's HR prior to d/c:  95  Assessment:    Influenza    Plan:   Exam findings, diagnosis etiology and medication use and indications reviewed with patient.  Follow- Up and discharge instructions provided. No emergent/urgent issues found on exam.  Based on the patient's clinical presentation, symptoms, and physical assessment, patient's findings are congruent with that of influenza.  Did not perform an influenza test, as diagnosis was made based on patient's clinical presentation.  Patient would like treatment with Tamiflu, and would also like medicine to help her with her cough.  Will provide symptomatic treatment with Bromfed for daytime and Promethazine DM cough medicine for nighttime.  Instructed patient that she  will need remain home until fever free for at least 24 hours.  Hydration was provided in the clinic due to patient's tachycardia. Repeated HR at time of discharge: 95.   There is no concern for cardiac disease as patient has no cardiac history.  Feel patient's tachycardia is most likely related to dehydration and her current viral state.  Patient education was provided. Patient verbalized understanding of information provided and agrees with plan of care (POC), all questions answered. The patient is advised to call or return to clinic if condition does not see an improvement in symptoms, or to seek the care of the closest emergency department if condition worsens with the above plan.   1. Influenza  - oseltamivir (TAMIFLU) 75 MG capsule; Take 1 capsule (75 mg total) by mouth 2 (two) times daily for 5 days.  Dispense: 10 capsule; Refill: 0 - brompheniramine-pseudoephedrine-DM 30-2-10 MG/5ML syrup; Take 5 mLs by mouth 4 (four) times daily as needed for up to 7 days.  Dispense: 150 mL; Refill: 0 - promethazine-dextromethorphan (PROMETHAZINE-DM) 6.25-15 MG/5ML syrup; Take 5 mLs by mouth 4 (four) times daily as needed for up to 7 days.  Dispense: 140 mL; Refill: 0 -Take medication as prescribed. -Ibuprofen 800 mg every 8 hours for the next 2 days.  Take this medication with food and water to protect the stomach lining. -Increase fluids. -Sleep elevated on at least 2 pillows at bedtime to help with cough. -Use a humidifier or vaporizer when at home and during sleep to help with cough. -May use a teaspoon of honey or over-the-counter cough drops to help with cough. -Remain home until fever-free for at least 24-48 hours. -Follow-up if symptoms do not improve. -Work note was provided for patient to return to work on 06/05/2018.

## 2018-07-26 ENCOUNTER — Other Ambulatory Visit: Payer: Self-pay

## 2018-07-26 ENCOUNTER — Ambulatory Visit (HOSPITAL_COMMUNITY)
Admission: EM | Admit: 2018-07-26 | Discharge: 2018-07-26 | Disposition: A | Discharge status: Home / Self Care | Payer: 59 | Attending: Family Medicine | Admitting: Family Medicine

## 2018-07-26 ENCOUNTER — Encounter (HOSPITAL_COMMUNITY): Payer: Self-pay | Admitting: *Deleted

## 2018-07-26 DIAGNOSIS — N898 Other specified noninflammatory disorders of vagina: Secondary | ICD-10-CM | POA: Insufficient documentation

## 2018-07-26 MED ORDER — METRONIDAZOLE 500 MG PO TABS: 500.0000 mg | ORAL_TABLET | Freq: Two times a day (BID) | ORAL | 0 refills | Status: DC

## 2018-07-26 MED ORDER — FLUCONAZOLE 150 MG PO TABS: 150.0000 mg | ORAL_TABLET | Freq: Every day | ORAL | 0 refills | Status: DC

## 2018-07-26 NOTE — ED Triage Notes (Signed)
C/O vaginal discharge without pain or fevers x approx 1 wk.

## 2018-07-26 NOTE — Discharge Instructions (Signed)
We will go ahead and treat you for a yeast and bacterial infection.  Medication sent to the pharmacy.  Lab results pending. We will call with any positive results.

## 2018-07-26 NOTE — ED Provider Notes (Signed)
[facility]    CSN: [id number] Arrival date & time: 07/26/18  1010     History   Chief Complaint Chief Complaint  Patient presents with   Vaginal Discharge    HPI Ashley Rush is a 33 y.o. female.   Pt is a 33 year old female that presents today with vaginal discharge. This has been present and remained the same over the past week. Describes the discharge as thick and white. No odor. Denies any itching or irritation. She has not taken anything for the symptoms. Hx of BV and reports this feels the same.  No concern for STD. Patient's last menstrual period was 07/05/2018 (exact date).  Denies any dysuria, hematuria, urinary frequency, fevers, chills, abdominal pain, back pain, flank pain.  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 Authorizing Provider  acetaminophen (TYLENOL) 500 MG tablet Take 500 mg by mouth every 6 (six) hours as needed.    [provider]  Dextromethorphan-guaiFENesin (ROBITUSSIN DM INFANT DROPS PO) Take by mouth.    [provider]  fluconazole (DIFLUCAN) 150 MG tablet Take 1 tablet (150 mg total) by mouth daily. 07/26/18   Dahlia ByesBast, Kurtiss Wence A, NP  metroNIDAZOLE (FLAGYL) 500 MG tablet Take 1 tablet (500 mg total) by mouth 2 (two) times daily. 07/26/18   Janace ArisBast, Johneisha Broaden  A, NP    Family History Family History  Problem Relation Age of Onset   Diabetes Mother    Heart disease Mother    Immunodeficiency Mother        HIV- deceased at age 34   Hypertension Mother    Diabetes Father    Heart disease Father    Immunodeficiency Father        HIV- died last year at 2156   Hypertension Father    Varicose Veins Father    Heart attack Father     Social History Social History   Tobacco Use   Smoking status: Never Smoker   Smokeless tobacco: Never Used  Substance Use Topics   Alcohol use: Yes    Alcohol/week: 3.0 standard drinks    Types: 3 Shots of liquor per week    Frequency: Never   Drug use: No     Allergies   Shellfish allergy   Review of Systems Review of Systems   Physical Exam Triage Vital Signs ED Triage Vitals  Enc Vitals Group     BP 07/26/18 1023 127/67     Pulse Rate 07/26/18 1023 72  Resp 07/26/18 1023 16     Temp 07/26/18 1023 97.9 F (36.6 C)     Temp Source 07/26/18 1023 Oral     SpO2 07/26/18 1023 100 %     Weight --      Height --      Head Circumference --      Peak Flow --      Pain Score 07/26/18 1031 0     Pain Loc --      Pain Edu? --      Excl. in GC? --    No data found.  Updated Vital Signs BP 127/67 (BP Location: Left Arm)    Pulse 72    Temp 97.9 F (36.6 C) (Oral)    Resp 16    LMP 07/05/2018 (Exact Date)    SpO2 100%    Breastfeeding No   Visual Acuity Right Eye Distance:   Left Eye Distance:   Bilateral Distance:    Right Eye Near:   Left Eye Near:    Bilateral Near:     Physical Exam Vitals signs and nursing note reviewed.  Constitutional:      General: She is not in acute distress.    Appearance: Normal appearance. She is not ill-appearing, toxic-appearing or diaphoretic.  HENT:     Head: Normocephalic.     Nose: Nose normal.     Mouth/Throat:     Pharynx: Oropharynx is clear.  Eyes:     Conjunctiva/sclera: Conjunctivae normal.  Neck:     Musculoskeletal:  Normal range of motion.  Pulmonary:     Effort: Pulmonary effort is normal.  Abdominal:     Palpations: Abdomen is soft.     Tenderness: There is no abdominal tenderness.  Musculoskeletal: Normal range of motion.  Skin:    General: Skin is warm and dry.     Findings: No rash.  Neurological:     Mental Status: She is alert.  Psychiatric:        Mood and Affect: Mood normal.      UC Treatments / Results  Labs (all labs ordered are listed, but only abnormal results are displayed) Labs Reviewed  CERVICOVAGINAL ANCILLARY ONLY    EKG None  Radiology No results found.  Procedures Procedures (including critical care time)  Medications Ordered in UC Medications - No data to display  Initial Impression / Assessment and Plan / UC Course  I have reviewed the triage vital signs and the nursing notes.  Pertinent labs & imaging results that were available during my care of the patient were reviewed by me and considered in my medical decision making (see chart for details).     We will go ahead and treat for bacterial vaginosis and yeast based on symptoms and history. Swab sent for testing and lab results pending Final Clinical Impressions(s) / UC Diagnoses   Final diagnoses:  Vaginal discharge     Discharge Instructions     We will go ahead and treat you for a yeast and bacterial infection.  Medication sent to the pharmacy.  Lab results pending. We will call with any positive results.      ED Prescriptions    Medication Sig Dispense Auth. Provider   metroNIDAZOLE (FLAGYL) 500 MG tablet Take 1 tablet (500 mg total) by mouth 2 (two) times daily. 14 tablet Analeya Luallen A, NP   fluconazole (DIFLUCAN) 150 MG tablet Take 1 tablet (150 mg total) by mouth daily. 2 tablet Janace Aris, NP  Controlled Substance Prescriptions [place] Controlled Substance Registry consulted? Not Applicable   Janace Aris, NP 07/26/18 1047

## 2018-07-27 LAB — CERVICOVAGINAL ANCILLARY ONLY
BACTERIAL VAGINITIS: POSITIVE — AB
Candida vaginitis: NEGATIVE
Chlamydia: NEGATIVE
NEISSERIA GONORRHEA: NEGATIVE
Trichomonas: NEGATIVE

## 2018-10-14 DIAGNOSIS — N911 Secondary amenorrhea: Secondary | ICD-10-CM | POA: Diagnosis not present

## 2018-10-14 DIAGNOSIS — Z3201 Encounter for pregnancy test, result positive: Secondary | ICD-10-CM | POA: Diagnosis not present

## 2018-10-27 DIAGNOSIS — Z3682 Encounter for antenatal screening for nuchal translucency: Secondary | ICD-10-CM | POA: Diagnosis not present

## 2018-10-27 DIAGNOSIS — Z368A Encounter for antenatal screening for other genetic defects: Secondary | ICD-10-CM | POA: Diagnosis not present

## 2018-10-27 DIAGNOSIS — Z1151 Encounter for screening for human papillomavirus (HPV): Secondary | ICD-10-CM | POA: Diagnosis not present

## 2018-10-27 DIAGNOSIS — O34211 Maternal care for low transverse scar from previous cesarean delivery: Secondary | ICD-10-CM | POA: Diagnosis not present

## 2018-10-27 DIAGNOSIS — Z124 Encounter for screening for malignant neoplasm of cervix: Secondary | ICD-10-CM | POA: Diagnosis not present

## 2018-10-27 DIAGNOSIS — Z3689 Encounter for other specified antenatal screening: Secondary | ICD-10-CM | POA: Diagnosis not present

## 2018-10-27 DIAGNOSIS — Z113 Encounter for screening for infections with a predominantly sexual mode of transmission: Secondary | ICD-10-CM | POA: Diagnosis not present

## 2018-10-27 DIAGNOSIS — Z3A11 11 weeks gestation of pregnancy: Secondary | ICD-10-CM | POA: Diagnosis not present

## 2018-12-16 DIAGNOSIS — Z363 Encounter for antenatal screening for malformations: Secondary | ICD-10-CM | POA: Diagnosis not present

## 2018-12-16 DIAGNOSIS — Z3A18 18 weeks gestation of pregnancy: Secondary | ICD-10-CM | POA: Diagnosis not present

## 2018-12-17 ENCOUNTER — Other Ambulatory Visit: Payer: Self-pay | Admitting: Specialist

## 2018-12-17 ENCOUNTER — Ambulatory Visit (HOSPITAL_COMMUNITY)
Admission: RE | Admit: 2018-12-17 | Discharge: 2018-12-17 | Disposition: A | Discharge status: Home / Self Care | Payer: 59 | Source: Ambulatory Visit | Attending: Obstetrics and Gynecology | Admitting: Obstetrics and Gynecology

## 2018-12-17 ENCOUNTER — Other Ambulatory Visit: Payer: Self-pay

## 2018-12-17 ENCOUNTER — Other Ambulatory Visit: Payer: Self-pay | Admitting: Obstetrics and Gynecology

## 2018-12-17 ENCOUNTER — Other Ambulatory Visit (HOSPITAL_COMMUNITY): Payer: Self-pay | Admitting: Obstetrics and Gynecology

## 2018-12-17 DIAGNOSIS — M79604 Pain in right leg: Secondary | ICD-10-CM | POA: Diagnosis present

## 2018-12-17 DIAGNOSIS — M7989 Other specified soft tissue disorders: Secondary | ICD-10-CM | POA: Diagnosis present

## 2018-12-17 NOTE — Progress Notes (Signed)
Lower extremity venous has been completed.   Preliminary results in CV Proc.   Abram Sander 12/17/2018 5:36 PM

## 2019-02-10 DIAGNOSIS — Z3689 Encounter for other specified antenatal screening: Secondary | ICD-10-CM | POA: Diagnosis not present

## 2019-02-17 DIAGNOSIS — R7309 Other abnormal glucose: Secondary | ICD-10-CM | POA: Diagnosis not present

## 2019-02-18 DIAGNOSIS — O24410 Gestational diabetes mellitus in pregnancy, diet controlled: Secondary | ICD-10-CM | POA: Diagnosis not present

## 2019-02-18 MED FILL — FREESTYLE LANCETS: 25 days supply | Qty: 100 | Fill #0

## 2019-02-18 MED FILL — FREESTYLE LITE METER: 30 days supply | Qty: 1 | Fill #0

## 2019-02-18 MED FILL — FREESTYLE LITE TEST STRIP: 25 days supply | Qty: 100 | Fill #0

## 2019-02-24 ENCOUNTER — Ambulatory Visit: Payer: 59

## 2019-02-24 MED FILL — METFORMIN HCL ER 500 MG TB2: 500 | 30 days supply | Qty: 60 | Fill #0

## 2019-03-03 ENCOUNTER — Other Ambulatory Visit: Payer: Self-pay

## 2019-03-03 ENCOUNTER — Encounter: Payer: 59 | Attending: Obstetrics and Gynecology | Admitting: Registered"

## 2019-03-03 DIAGNOSIS — O99810 Abnormal glucose complicating pregnancy: Secondary | ICD-10-CM | POA: Insufficient documentation

## 2019-03-03 MED FILL — metFORMIN HCL 1000 MG TABS: 1000 | 90 days supply | Qty: 180 | Fill #0

## 2019-03-05 ENCOUNTER — Encounter: Payer: Self-pay | Admitting: Registered"

## 2019-03-05 NOTE — Progress Notes (Signed)
Patient was seen on 03/03/19 for Gestational Diabetes self-management class at the [facility]. The following learning objectives were met by the patient during this course:   States the definition of Gestational Diabetes  States why dietary management is important in controlling blood glucose  Describes the effects each nutrient has on blood glucose levels  Demonstrates ability to create a balanced meal plan  Demonstrates carbohydrate counting   States when to check blood glucose levels  Demonstrates proper blood glucose monitoring techniques  States the effect of stress and exercise on blood glucose levels  States the importance of limiting caffeine and abstaining from alcohol and smoking  Blood glucose monitor given: none ([employer] Employee)  Patient instructed to monitor glucose levels: FBS: 60 - <95; 1 hour: <140; 2 hour: <120  Patient received handouts:  Nutrition Diabetes and Pregnancy, including carb counting list  Patient will be seen for follow-up as needed.  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

## 2019-03-31 DIAGNOSIS — O24419 Gestational diabetes mellitus in pregnancy, unspecified control: Secondary | ICD-10-CM | POA: Diagnosis not present

## 2019-03-31 DIAGNOSIS — Z3A33 33 weeks gestation of pregnancy: Secondary | ICD-10-CM | POA: Diagnosis not present

## 2019-04-05 DIAGNOSIS — O24415 Gestational diabetes mellitus in pregnancy, controlled by oral hypoglycemic drugs: Secondary | ICD-10-CM | POA: Diagnosis not present

## 2019-04-05 DIAGNOSIS — Z3A34 34 weeks gestation of pregnancy: Secondary | ICD-10-CM | POA: Diagnosis not present

## 2019-04-08 DIAGNOSIS — O24410 Gestational diabetes mellitus in pregnancy, diet controlled: Secondary | ICD-10-CM | POA: Diagnosis not present

## 2019-04-08 DIAGNOSIS — Z3A35 35 weeks gestation of pregnancy: Secondary | ICD-10-CM | POA: Diagnosis not present

## 2019-04-12 DIAGNOSIS — O24415 Gestational diabetes mellitus in pregnancy, controlled by oral hypoglycemic drugs: Secondary | ICD-10-CM | POA: Diagnosis not present

## 2019-04-12 DIAGNOSIS — Z3A35 35 weeks gestation of pregnancy: Secondary | ICD-10-CM | POA: Diagnosis not present

## 2019-04-15 DIAGNOSIS — Z3A36 36 weeks gestation of pregnancy: Secondary | ICD-10-CM | POA: Diagnosis not present

## 2019-04-15 DIAGNOSIS — O368130 Decreased fetal movements, third trimester, not applicable or unspecified: Secondary | ICD-10-CM | POA: Diagnosis not present

## 2019-04-15 DIAGNOSIS — O24415 Gestational diabetes mellitus in pregnancy, controlled by oral hypoglycemic drugs: Secondary | ICD-10-CM | POA: Diagnosis not present

## 2019-04-19 DIAGNOSIS — Z3A36 36 weeks gestation of pregnancy: Secondary | ICD-10-CM | POA: Diagnosis not present

## 2019-04-19 DIAGNOSIS — O24415 Gestational diabetes mellitus in pregnancy, controlled by oral hypoglycemic drugs: Secondary | ICD-10-CM | POA: Diagnosis not present

## 2019-04-22 DIAGNOSIS — O24415 Gestational diabetes mellitus in pregnancy, controlled by oral hypoglycemic drugs: Secondary | ICD-10-CM | POA: Diagnosis not present

## 2019-04-22 DIAGNOSIS — Z3A37 37 weeks gestation of pregnancy: Secondary | ICD-10-CM | POA: Diagnosis not present

## 2019-04-23 ENCOUNTER — Encounter (HOSPITAL_COMMUNITY)
Admission: AD | Disposition: A | Discharge status: Home / Self Care | Payer: Self-pay | Source: Home / Self Care | Attending: Obstetrics and Gynecology

## 2019-04-23 ENCOUNTER — Inpatient Hospital Stay (HOSPITAL_COMMUNITY): Payer: 59 | Admitting: Certified Registered"

## 2019-04-23 ENCOUNTER — Other Ambulatory Visit: Payer: Self-pay

## 2019-04-23 ENCOUNTER — Encounter (HOSPITAL_COMMUNITY): Payer: Self-pay | Admitting: Obstetrics and Gynecology

## 2019-04-23 ENCOUNTER — Inpatient Hospital Stay (HOSPITAL_COMMUNITY): Payer: 59

## 2019-04-23 ENCOUNTER — Inpatient Hospital Stay (HOSPITAL_COMMUNITY)
Admission: AD | Admit: 2019-04-23 | Discharge: 2019-04-27 | DRG: 786 | Disposition: A | Discharge status: Home / Self Care | Payer: 59 | Attending: Obstetrics and Gynecology | Admitting: Obstetrics and Gynecology

## 2019-04-23 DIAGNOSIS — O98513 Other viral diseases complicating pregnancy, third trimester: Secondary | ICD-10-CM

## 2019-04-23 DIAGNOSIS — O1493 Unspecified pre-eclampsia, third trimester: Secondary | ICD-10-CM

## 2019-04-23 DIAGNOSIS — Z7901 Long term (current) use of anticoagulants: Secondary | ICD-10-CM

## 2019-04-23 DIAGNOSIS — O133 Gestational [pregnancy-induced] hypertension without significant proteinuria, third trimester: Secondary | ICD-10-CM

## 2019-04-23 DIAGNOSIS — O479 False labor, unspecified: Secondary | ICD-10-CM

## 2019-04-23 DIAGNOSIS — Z86718 Personal history of other venous thrombosis and embolism: Secondary | ICD-10-CM | POA: Diagnosis not present

## 2019-04-23 DIAGNOSIS — R0602 Shortness of breath: Secondary | ICD-10-CM | POA: Diagnosis not present

## 2019-04-23 DIAGNOSIS — Z86711 Personal history of pulmonary embolism: Secondary | ICD-10-CM

## 2019-04-23 DIAGNOSIS — Z3A37 37 weeks gestation of pregnancy: Secondary | ICD-10-CM

## 2019-04-23 DIAGNOSIS — U071 COVID-19: Secondary | ICD-10-CM

## 2019-04-23 DIAGNOSIS — O9852 Other viral diseases complicating childbirth: Secondary | ICD-10-CM | POA: Diagnosis not present

## 2019-04-23 DIAGNOSIS — O9952 Diseases of the respiratory system complicating childbirth: Secondary | ICD-10-CM | POA: Diagnosis present

## 2019-04-23 DIAGNOSIS — Z98891 History of uterine scar from previous surgery: Secondary | ICD-10-CM | POA: Diagnosis not present

## 2019-04-23 DIAGNOSIS — O289 Unspecified abnormal findings on antenatal screening of mother: Secondary | ICD-10-CM

## 2019-04-23 DIAGNOSIS — J1289 Other viral pneumonia: Secondary | ICD-10-CM | POA: Diagnosis present

## 2019-04-23 DIAGNOSIS — O2693 Pregnancy related conditions, unspecified, third trimester: Secondary | ICD-10-CM | POA: Diagnosis not present

## 2019-04-23 DIAGNOSIS — O134 Gestational [pregnancy-induced] hypertension without significant proteinuria, complicating childbirth: Principal | ICD-10-CM | POA: Diagnosis present

## 2019-04-23 DIAGNOSIS — O34219 Maternal care for unspecified type scar from previous cesarean delivery: Secondary | ICD-10-CM | POA: Diagnosis not present

## 2019-04-23 DIAGNOSIS — O26893 Other specified pregnancy related conditions, third trimester: Secondary | ICD-10-CM | POA: Diagnosis present

## 2019-04-23 DIAGNOSIS — O24415 Gestational diabetes mellitus in pregnancy, controlled by oral hypoglycemic drugs: Secondary | ICD-10-CM

## 2019-04-23 DIAGNOSIS — O34211 Maternal care for low transverse scar from previous cesarean delivery: Secondary | ICD-10-CM | POA: Diagnosis not present

## 2019-04-23 DIAGNOSIS — J069 Acute upper respiratory infection, unspecified: Secondary | ICD-10-CM | POA: Diagnosis not present

## 2019-04-23 DIAGNOSIS — O411230 Chorioamnionitis, third trimester, not applicable or unspecified: Secondary | ICD-10-CM | POA: Diagnosis present

## 2019-04-23 DIAGNOSIS — Z3A00 Weeks of gestation of pregnancy not specified: Secondary | ICD-10-CM | POA: Diagnosis not present

## 2019-04-23 HISTORY — DX: Essential (primary) hypertension: I10

## 2019-04-23 HISTORY — DX: Acute embolism and thrombosis of unspecified deep veins of unspecified lower extremity: I82.409

## 2019-04-23 HISTORY — DX: Gestational diabetes mellitus in pregnancy, unspecified control: O24.419

## 2019-04-23 HISTORY — DX: Coronavirus infection, unspecified: B34.2

## 2019-04-23 LAB — CBC
HCT: 42.9 % (ref 36.0–46.0)
Hemoglobin: 14.7 g/dL (ref 12.0–15.0)
MCH: 30.7 pg (ref 26.0–34.0)
MCHC: 34.3 g/dL (ref 30.0–36.0)
MCV: 89.6 fL (ref 80.0–100.0)
Platelets: 147 10*3/uL — ABNORMAL LOW (ref 150–400)
RBC: 4.79 MIL/uL (ref 3.87–5.11)
RDW: 13.4 % (ref 11.5–15.5)
WBC: 3.7 10*3/uL — ABNORMAL LOW (ref 4.0–10.5)
nRBC: 0 % (ref 0.0–0.2)

## 2019-04-23 LAB — COMPREHENSIVE METABOLIC PANEL
ALT: 29 U/L (ref 0–44)
AST: 42 U/L — ABNORMAL HIGH (ref 15–41)
Albumin: 2.6 g/dL — ABNORMAL LOW (ref 3.5–5.0)
Alkaline Phosphatase: 165 U/L — ABNORMAL HIGH (ref 38–126)
Anion gap: 10 (ref 5–15)
BUN: 7 mg/dL (ref 6–20)
CO2: 20 mmol/L — ABNORMAL LOW (ref 22–32)
Calcium: 9 mg/dL (ref 8.9–10.3)
Chloride: 106 mmol/L (ref 98–111)
Creatinine, Ser: 0.97 mg/dL (ref 0.44–1.00)
GFR calc Af Amer: >60 mL/min (ref >60)
GFR calc non Af Amer: >60 mL/min (ref >60)
Glucose, Bld: 116 mg/dL — ABNORMAL HIGH (ref 70–99)
Potassium: 4.1 mmol/L (ref 3.5–5.1)
Sodium: 136 mmol/L (ref 135–145)
Total Bilirubin: 0.7 mg/dL (ref 0.3–1.2)
Total Protein: 6 g/dL — ABNORMAL LOW (ref 6.5–8.1)

## 2019-04-23 LAB — PROTEIN / CREATININE RATIO, URINE
Creatinine, Urine: 137.27 mg/dL
Protein Creatinine Ratio: 1.07 mg/mg{Cre} — ABNORMAL HIGH (ref 0.00–0.15)
Total Protein, Urine: 147 mg/dL

## 2019-04-23 LAB — GLUCOSE, CAPILLARY: Glucose-Capillary: 126 mg/dL — ABNORMAL HIGH (ref 70–99)

## 2019-04-23 LAB — TYPE AND SCREEN
ABO/RH(D): O POS
Antibody Screen: NEGATIVE

## 2019-04-23 LAB — ABO/RH: ABO/RH(D): O POS

## 2019-04-23 SURGERY — Surgical Case
Anesthesia: Spinal | Site: Abdomen | Wound class: Clean Contaminated

## 2019-04-23 MED ORDER — SODIUM CHLORIDE 0.9% FLUSH: 3.0000 mL | INTRAVENOUS | Status: DC | PRN

## 2019-04-23 MED ORDER — SIMETHICONE 80 MG PO CHEW
80.0000 mg | CHEWABLE_TABLET | ORAL | Status: DC
  Administered 2019-04-23 – 2019-04-26 (×4): 80 mg via ORAL
  Filled 2019-04-23 (×4): qty 1

## 2019-04-23 MED ORDER — DIBUCAINE (PERIANAL) 1 % EX OINT: 1.0000 "application " | TOPICAL_OINTMENT | CUTANEOUS | Status: DC | PRN

## 2019-04-23 MED ORDER — MAGNESIUM SULFATE 40 GM/1000ML IV SOLN
2.0000 g/h | INTRAVENOUS | Status: AC
  Administered 2019-04-23 – 2019-04-24 (×2): 2 g/h via INTRAVENOUS
  Filled 2019-04-23: qty 1000

## 2019-04-23 MED ORDER — SIMETHICONE 80 MG PO CHEW: 80.0000 mg | CHEWABLE_TABLET | ORAL | Status: DC | PRN

## 2019-04-23 MED ORDER — SENNOSIDES-DOCUSATE SODIUM 8.6-50 MG PO TABS
2.0000 | ORAL_TABLET | ORAL | Status: DC
  Administered 2019-04-23 – 2019-04-26 (×4): 2 via ORAL
  Filled 2019-04-23 (×4): qty 2

## 2019-04-23 MED ORDER — NALOXONE HCL 0.4 MG/ML IJ SOLN: 0.4000 mg | INTRAMUSCULAR | Status: DC | PRN

## 2019-04-23 MED ORDER — LACTATED RINGERS IV SOLN: INTRAVENOUS | Status: DC

## 2019-04-23 MED ORDER — MORPHINE SULFATE (PF) 0.5 MG/ML IJ SOLN
INTRAMUSCULAR | Status: DC | PRN
  Administered 2019-04-23: .15 mg via INTRATHECAL

## 2019-04-23 MED ORDER — ONDANSETRON HCL 4 MG/2ML IJ SOLN
INTRAMUSCULAR | Status: DC | PRN
  Administered 2019-04-23: 4 mg via INTRAVENOUS

## 2019-04-23 MED ORDER — FENTANYL CITRATE (PF) 100 MCG/2ML IJ SOLN
INTRAMUSCULAR | Status: DC | PRN
  Administered 2019-04-23: 15 ug via INTRATHECAL

## 2019-04-23 MED ORDER — IBUPROFEN 800 MG PO TABS
800.0000 mg | ORAL_TABLET | Freq: Three times a day (TID) | ORAL | Status: AC
  Administered 2019-04-23 – 2019-04-26 (×9): 800 mg via ORAL
  Filled 2019-04-23 (×8): qty 1

## 2019-04-23 MED ORDER — FENTANYL CITRATE (PF) 100 MCG/2ML IJ SOLN: 100.0000 ug | Freq: Once | INTRAMUSCULAR | Status: AC

## 2019-04-23 MED ORDER — ZOLPIDEM TARTRATE 5 MG PO TABS: 5.0000 mg | ORAL_TABLET | Freq: Every evening | ORAL | Status: DC | PRN

## 2019-04-23 MED ORDER — FAMOTIDINE IN NACL 20-0.9 MG/50ML-% IV SOLN
20.0000 mg | Freq: Once | INTRAVENOUS | Status: AC
  Administered 2019-04-23: 20 mg via INTRAVENOUS
  Filled 2019-04-23: qty 50

## 2019-04-23 MED ORDER — OXYCODONE HCL 5 MG PO TABS
5.0000 mg | ORAL_TABLET | ORAL | Status: DC | PRN
  Administered 2019-04-24: 5 mg via ORAL
  Administered 2019-04-25 – 2019-04-27 (×8): 10 mg via ORAL
  Filled 2019-04-23 (×4): qty 2
  Filled 2019-04-23: qty 1
  Filled 2019-04-23 (×4): qty 2

## 2019-04-23 MED ORDER — SOD CITRATE-CITRIC ACID 500-334 MG/5ML PO SOLN
30.0000 mL | Freq: Once | ORAL | Status: AC
  Administered 2019-04-23: 08:00:00 30 mL via ORAL
  Filled 2019-04-23: qty 30

## 2019-04-23 MED ORDER — PHENYLEPHRINE 40 MCG/ML (10ML) SYRINGE FOR IV PUSH (FOR BLOOD PRESSURE SUPPORT)
PREFILLED_SYRINGE | INTRAVENOUS | Status: AC
  Filled 2019-04-23: qty 10

## 2019-04-23 MED ORDER — TERBUTALINE SULFATE 1 MG/ML IJ SOLN
0.2500 mg | Freq: Once | INTRAMUSCULAR | Status: AC
  Administered 2019-04-23: 0.25 mg via SUBCUTANEOUS

## 2019-04-23 MED ORDER — PHENYLEPHRINE HCL-NACL 20-0.9 MG/250ML-% IV SOLN
INTRAVENOUS | Status: DC | PRN
  Administered 2019-04-23: 60 ug/min via INTRAVENOUS

## 2019-04-23 MED ORDER — OXYCODONE HCL 5 MG/5ML PO SOLN: 5.0000 mg | Freq: Once | ORAL | Status: DC | PRN

## 2019-04-23 MED ORDER — ONDANSETRON HCL 4 MG/2ML IJ SOLN: 4.0000 mg | Freq: Once | INTRAMUSCULAR | Status: DC | PRN

## 2019-04-23 MED ORDER — OXYTOCIN 40 UNITS IN NORMAL SALINE INFUSION - SIMPLE MED
2.5000 [IU]/h | INTRAVENOUS | Status: AC
  Administered 2019-04-23: 2.5 [IU]/h via INTRAVENOUS

## 2019-04-23 MED ORDER — NALOXONE HCL 4 MG/10ML IJ SOLN
1.0000 ug/kg/h | INTRAVENOUS | Status: DC | PRN
  Filled 2019-04-23: qty 5

## 2019-04-23 MED ORDER — SIMETHICONE 80 MG PO CHEW
80.0000 mg | CHEWABLE_TABLET | Freq: Three times a day (TID) | ORAL | Status: DC
  Administered 2019-04-23 – 2019-04-27 (×10): 80 mg via ORAL
  Filled 2019-04-23 (×10): qty 1

## 2019-04-23 MED ORDER — MORPHINE SULFATE (PF) 0.5 MG/ML IJ SOLN
INTRAMUSCULAR | Status: AC
  Filled 2019-04-23: qty 10

## 2019-04-23 MED ORDER — PHENYLEPHRINE HCL-NACL 20-0.9 MG/250ML-% IV SOLN
INTRAVENOUS | Status: AC
  Filled 2019-04-23: qty 250

## 2019-04-23 MED ORDER — MENTHOL 3 MG MT LOZG
1.0000 | LOZENGE | OROMUCOSAL | Status: DC | PRN
  Administered 2019-04-24: 3 mg via ORAL
  Filled 2019-04-23 (×2): qty 9

## 2019-04-23 MED ORDER — SCOPOLAMINE 1 MG/3DAYS TD PT72
1.0000 | MEDICATED_PATCH | Freq: Once | TRANSDERMAL | Status: AC
  Administered 2019-04-23: 1.5 mg via TRANSDERMAL

## 2019-04-23 MED ORDER — TETANUS-DIPHTH-ACELL PERTUSSIS 5-2.5-18.5 LF-MCG/0.5 IM SUSP: 0.5000 mL | Freq: Once | INTRAMUSCULAR | Status: DC

## 2019-04-23 MED ORDER — KETOROLAC TROMETHAMINE 30 MG/ML IJ SOLN
INTRAMUSCULAR | Status: AC
  Filled 2019-04-23: qty 1

## 2019-04-23 MED ORDER — OXYCODONE HCL 5 MG PO TABS: 5.0000 mg | ORAL_TABLET | Freq: Once | ORAL | Status: DC | PRN

## 2019-04-23 MED ORDER — OXYTOCIN 40 UNITS IN NORMAL SALINE INFUSION - SIMPLE MED
INTRAVENOUS | Status: AC
  Filled 2019-04-23: qty 1000

## 2019-04-23 MED ORDER — MAGNESIUM SULFATE BOLUS VIA INFUSION
4.0000 g | Freq: Once | INTRAVENOUS | Status: AC
  Administered 2019-04-23: 4 g via INTRAVENOUS
  Filled 2019-04-23: qty 1000

## 2019-04-23 MED ORDER — OXYTOCIN 40 UNITS IN NORMAL SALINE INFUSION - SIMPLE MED
INTRAVENOUS | Status: DC | PRN
  Administered 2019-04-23 (×3): 75 mL via INTRAVENOUS

## 2019-04-23 MED ORDER — FENTANYL CITRATE (PF) 250 MCG/5ML IJ SOLN
INTRAMUSCULAR | Status: AC
  Filled 2019-04-23: qty 5

## 2019-04-23 MED ORDER — BUPIVACAINE IN DEXTROSE 0.75-8.25 % IT SOLN
INTRATHECAL | Status: DC | PRN
  Administered 2019-04-23: 1.6 mL via INTRATHECAL

## 2019-04-23 MED ORDER — MEPERIDINE HCL 25 MG/ML IJ SOLN
6.2500 mg | INTRAMUSCULAR | Status: DC | PRN
  Administered 2019-04-23: 6.25 mg via INTRAVENOUS

## 2019-04-23 MED ORDER — DIPHENHYDRAMINE HCL 25 MG PO CAPS: 25.0000 mg | ORAL_CAPSULE | ORAL | Status: DC | PRN

## 2019-04-23 MED ORDER — NALBUPHINE HCL 10 MG/ML IJ SOLN: 5.0000 mg | Freq: Once | INTRAMUSCULAR | Status: AC | PRN

## 2019-04-23 MED ORDER — MAGNESIUM SULFATE 40 GM/1000ML IV SOLN
INTRAVENOUS | Status: AC
  Filled 2019-04-23: qty 1000

## 2019-04-23 MED ORDER — NALBUPHINE HCL 10 MG/ML IJ SOLN
5.0000 mg | Freq: Once | INTRAMUSCULAR | Status: AC | PRN
  Administered 2019-04-23: 13:00:00 5 mg via SUBCUTANEOUS

## 2019-04-23 MED ORDER — FENTANYL CITRATE (PF) 100 MCG/2ML IJ SOLN
INTRAMUSCULAR | Status: AC
  Administered 2019-04-23: 100 ug via INTRAVENOUS
  Filled 2019-04-23: qty 2

## 2019-04-23 MED ORDER — DEXAMETHASONE SODIUM PHOSPHATE 4 MG/ML IJ SOLN
INTRAMUSCULAR | Status: AC
  Filled 2019-04-23: qty 1

## 2019-04-23 MED ORDER — WITCH HAZEL-GLYCERIN EX PADS: 1.0000 "application " | MEDICATED_PAD | CUTANEOUS | Status: DC | PRN

## 2019-04-23 MED ORDER — PHENYLEPHRINE HCL (PRESSORS) 10 MG/ML IV SOLN
INTRAVENOUS | Status: DC | PRN
  Administered 2019-04-23: 80 ug via INTRAVENOUS

## 2019-04-23 MED ORDER — PRENATAL MULTIVITAMIN CH
1.0000 | ORAL_TABLET | Freq: Every day | ORAL | Status: DC
  Administered 2019-04-24 – 2019-04-26 (×3): 1 via ORAL
  Filled 2019-04-23 (×3): qty 1

## 2019-04-23 MED ORDER — SCOPOLAMINE 1 MG/3DAYS TD PT72
MEDICATED_PATCH | TRANSDERMAL | Status: AC
  Filled 2019-04-23: qty 1

## 2019-04-23 MED ORDER — DIPHENHYDRAMINE HCL 25 MG PO CAPS: 25.0000 mg | ORAL_CAPSULE | Freq: Four times a day (QID) | ORAL | Status: DC | PRN

## 2019-04-23 MED ORDER — DIPHENHYDRAMINE HCL 50 MG/ML IJ SOLN
INTRAMUSCULAR | Status: AC
  Filled 2019-04-23: qty 1

## 2019-04-23 MED ORDER — LIDOCAINE 2% (20 MG/ML) 5 ML SYRINGE
INTRAMUSCULAR | Status: AC
  Filled 2019-04-23: qty 5

## 2019-04-23 MED ORDER — ACETAMINOPHEN 500 MG PO TABS
1000.0000 mg | ORAL_TABLET | Freq: Four times a day (QID) | ORAL | Status: DC
  Administered 2019-04-23 – 2019-04-27 (×13): 1000 mg via ORAL
  Filled 2019-04-23 (×15): qty 2

## 2019-04-23 MED ORDER — LIDOCAINE HCL (PF) 1 % IJ SOLN
INTRAMUSCULAR | Status: AC
  Filled 2019-04-23: qty 5

## 2019-04-23 MED ORDER — MEPERIDINE HCL 25 MG/ML IJ SOLN
INTRAMUSCULAR | Status: AC
  Filled 2019-04-23: qty 1

## 2019-04-23 MED ORDER — CEFAZOLIN SODIUM-DEXTROSE 2-4 GM/100ML-% IV SOLN
2.0000 g | INTRAVENOUS | Status: DC
  Filled 2019-04-23: qty 100

## 2019-04-23 MED ORDER — SUCCINYLCHOLINE CHLORIDE 200 MG/10ML IV SOSY
PREFILLED_SYRINGE | INTRAVENOUS | Status: AC
  Filled 2019-04-23: qty 10

## 2019-04-23 MED ORDER — COCONUT OIL OIL: 1.0000 "application " | TOPICAL_OIL | Status: DC | PRN

## 2019-04-23 MED ORDER — FENTANYL CITRATE (PF) 100 MCG/2ML IJ SOLN
25.0000 ug | INTRAMUSCULAR | Status: DC | PRN
  Administered 2019-04-23: 50 ug via INTRAVENOUS
  Administered 2019-04-23: 25 ug via INTRAVENOUS

## 2019-04-23 MED ORDER — NALBUPHINE HCL 10 MG/ML IJ SOLN
5.0000 mg | INTRAMUSCULAR | Status: DC | PRN
  Administered 2019-04-24: 5 mg via INTRAVENOUS
  Filled 2019-04-23: qty 1

## 2019-04-23 MED ORDER — ONDANSETRON HCL 4 MG/2ML IJ SOLN: 4.0000 mg | Freq: Three times a day (TID) | INTRAMUSCULAR | Status: DC | PRN

## 2019-04-23 MED ORDER — CEFAZOLIN SODIUM-DEXTROSE 2-3 GM-%(50ML) IV SOLR
INTRAVENOUS | Status: DC | PRN
  Administered 2019-04-23: 2 g via INTRAVENOUS

## 2019-04-23 MED ORDER — DIPHENHYDRAMINE HCL 50 MG/ML IJ SOLN
12.5000 mg | INTRAMUSCULAR | Status: DC | PRN
  Administered 2019-04-23: 12.5 mg via INTRAVENOUS

## 2019-04-23 MED ORDER — FENTANYL CITRATE (PF) 100 MCG/2ML IJ SOLN
INTRAMUSCULAR | Status: AC
  Filled 2019-04-23: qty 2

## 2019-04-23 MED ORDER — PROPOFOL 10 MG/ML IV BOLUS
INTRAVENOUS | Status: AC
  Filled 2019-04-23: qty 20

## 2019-04-23 MED ORDER — NALBUPHINE HCL 10 MG/ML IJ SOLN
5.0000 mg | INTRAMUSCULAR | Status: DC | PRN
  Administered 2019-04-23 (×2): 5 mg via SUBCUTANEOUS
  Filled 2019-04-23 (×2): qty 1

## 2019-04-23 MED ORDER — ONDANSETRON HCL 4 MG/2ML IJ SOLN
INTRAMUSCULAR | Status: AC
  Filled 2019-04-23: qty 2

## 2019-04-23 MED ORDER — TERBUTALINE SULFATE 1 MG/ML IJ SOLN
INTRAMUSCULAR | Status: AC
  Filled 2019-04-23: qty 1

## 2019-04-23 MED ORDER — BENZONATATE 100 MG PO CAPS
100.0000 mg | ORAL_CAPSULE | Freq: Three times a day (TID) | ORAL | Status: DC | PRN
  Administered 2019-04-23 – 2019-04-27 (×5): 100 mg via ORAL
  Filled 2019-04-23 (×7): qty 1

## 2019-04-23 SURGICAL SUPPLY — 36 items
BENZOIN TINCTURE PRP APPL 2/3 (GAUZE/BANDAGES/DRESSINGS) ×2 IMPLANT
CHLORAPREP W/TINT 26ML (MISCELLANEOUS) ×3 IMPLANT
CLAMP CORD UMBIL (MISCELLANEOUS) IMPLANT
CLOSURE STERI-STRIP 1/2X4 (GAUZE/BANDAGES/DRESSINGS) ×1
CLOTH BEACON ORANGE TIMEOUT ST (SAFETY) ×3 IMPLANT
CLSR STERI-STRIP ANTIMIC 1/2X4 (GAUZE/BANDAGES/DRESSINGS) ×1 IMPLANT
DRSG OPSITE POSTOP 4X10 (GAUZE/BANDAGES/DRESSINGS) ×3 IMPLANT
ELECT REM PT RETURN 9FT ADLT (ELECTROSURGICAL) ×3
ELECTRODE REM PT RTRN 9FT ADLT (ELECTROSURGICAL) ×1 IMPLANT
EXTRACTOR VACUUM KIWI (MISCELLANEOUS) IMPLANT
EXTRACTOR VACUUM M CUP 4 TUBE (SUCTIONS) IMPLANT
EXTRACTOR VACUUM M CUP 4' TUBE (SUCTIONS)
GLOVE BIOGEL PI IND STRL 7.0 (GLOVE) ×1 IMPLANT
GLOVE BIOGEL PI INDICATOR 7.0 (GLOVE) ×2
GLOVE ORTHO TXT STRL SZ7.5 (GLOVE) ×3 IMPLANT
GOWN STRL REUS W/TWL LRG LVL3 (GOWN DISPOSABLE) ×6 IMPLANT
KIT ABG SYR 3ML LUER SLIP (SYRINGE) IMPLANT
NDL HYPO 25X5/8 SAFETYGLIDE (NEEDLE) ×1 IMPLANT
NEEDLE HYPO 25X5/8 SAFETYGLIDE (NEEDLE) ×3 IMPLANT
NS IRRIG 1000ML POUR BTL (IV SOLUTION) ×3 IMPLANT
PACK C SECTION WH (CUSTOM PROCEDURE TRAY) ×3 IMPLANT
PAD OB MATERNITY 4.3X12.25 (PERSONAL CARE ITEMS) ×3 IMPLANT
PENCIL SMOKE EVAC W/HOLSTER (ELECTROSURGICAL) ×3 IMPLANT
RTRCTR C-SECT PINK 25CM LRG (MISCELLANEOUS) ×3 IMPLANT
SUT CHROMIC 1 CTX 36 (SUTURE) ×6 IMPLANT
SUT PLAIN 0 NONE (SUTURE) IMPLANT
SUT PLAIN 2 0 XLH (SUTURE) IMPLANT
SUT VIC AB 0 CT1 27 (SUTURE) ×4
SUT VIC AB 0 CT1 27XBRD ANBCTR (SUTURE) ×2 IMPLANT
SUT VIC AB 2-0 CT1 (SUTURE) ×3 IMPLANT
SUT VIC AB 2-0 CT1 27 (SUTURE) ×2
SUT VIC AB 2-0 CT1 TAPERPNT 27 (SUTURE) ×1 IMPLANT
SUT VIC AB 4-0 KS 27 (SUTURE) IMPLANT
TOWEL OR 17X24 6PK STRL BLUE (TOWEL DISPOSABLE) ×3 IMPLANT
TRAY FOLEY W/BAG SLVR 14FR LF (SET/KITS/TRAYS/PACK) ×3 IMPLANT
WATER STERILE IRR 1000ML POUR (IV SOLUTION) ×3 IMPLANT

## 2019-04-23 NOTE — Progress Notes (Signed)
Pt. Moved to [facility] and oriented to room. Ambulated to the wheel chair and to the bed.

## 2019-04-23 NOTE — Transfer of Care (Signed)
Immediate Anesthesia Transfer of Care Note  Patient: Ashley Rush  Procedure(s) Performed: CESAREAN SECTION (N/A Abdomen)  Patient Location: PACU  Anesthesia Type:Spinal  Level of Consciousness: awake, alert  and oriented  Airway & Oxygen Therapy: Patient Spontanous Breathing  Post-op Assessment: Report given to RN and Post -op Vital signs reviewed and stable  Post vital signs: Reviewed and stable  Last Vitals:  Vitals Value Taken Time  BP 130/97 04/23/19 1030  Temp    Pulse 102 04/23/19 1039  Resp 22 04/23/19 1039  SpO2 100 % 04/23/19 1039  Vitals shown include unvalidated device data.  Last Pain:  Vitals:   04/23/19 1015  TempSrc:   PainSc: 0-No pain      Patients Stated Pain Goal: 0 (03/88/82 8003)  Complications: No apparent anesthesia complications

## 2019-04-23 NOTE — Progress Notes (Signed)
Carmelia Roller CNM notified of pt's FM strip being unreactive. Will start IV flds and do BPP.

## 2019-04-23 NOTE — Anesthesia Postprocedure Evaluation (Signed)
Anesthesia Post Note  Patient: Ashley Rush  Procedure(s) Performed: CESAREAN SECTION (N/A Abdomen)     Patient location during evaluation: PACU Anesthesia Type: Spinal Level of consciousness: oriented and awake and alert Pain management: pain level controlled Vital Signs Assessment: post-procedure vital signs reviewed and stable Respiratory status: spontaneous breathing, respiratory function stable and nonlabored ventilation Cardiovascular status: blood pressure returned to baseline and stable Postop Assessment: no headache, no backache, no apparent nausea or vomiting and spinal receding Anesthetic complications: no    Last Vitals:  Vitals:   04/23/19 1140 04/23/19 1145  BP:    Pulse: (!) 101 (!) 106  Resp: (!) 25 17  Temp:    SpO2: 96% 94%    Last Pain:  Vitals:   04/23/19 1145  TempSrc:   PainSc: 2    Pain Goal: Patients Stated Pain Goal: 0 (04/23/19 0243)  LLE Motor Response: Purposeful movement (04/23/19 1115) LLE Sensation: Tingling (04/23/19 1115) RLE Motor Response: Purposeful movement (04/23/19 1115) RLE Sensation: Tingling (04/23/19 1115)     Epidural/Spinal Function Cutaneous sensation: Tingles (04/23/19 1115), Patient able to flex knees: Yes (04/23/19 1115), Patient able to lift hips off bed: No (04/23/19 1115), Back pain beyond tenderness at insertion site: No (04/23/19 1115), Progressively worsening motor and/or sensory loss: No (04/23/19 1115), Bowel and/or bladder incontinence post epidural: No (04/23/19 1115)  Lidia Collum

## 2019-04-23 NOTE — MAU Provider Note (Signed)
Chief Complaint:  Contractions   First Provider Initiated Contact with Patient 04/23/19 [phone]     HPI: Ashley Rush is a 33 y.o. G5P1031 at 81w1dwho presents to maternity admissions reporting contractions.  Has been diagnosed with Covid and has symptoms.  Recently diagnosed with hypertension, but no labs drawn and delivery plan has not changed. Denies new symptoms, except headache with covid. She reports good fetal movement, denies LOF, vaginal bleeding, vaginal itching/burning, urinary symptoms, dizziness, n/v, diarrhea, constipation or fever/chills.  She denies visual changes or RUQ abdominal pain.  Abdominal Pain This is a new problem. The current episode started today. The onset quality is gradual. The problem occurs intermittently. The problem has been unchanged. The pain is moderate. The quality of the pain is cramping. The abdominal pain does not radiate. Associated symptoms include headaches. Pertinent negatives include no constipation, diarrhea, dysuria, fever, nausea or vomiting. Nothing aggravates the pain. The pain is relieved by nothing. She has tried nothing for the symptoms.  Headache  This is a new problem. The current episode started in the past 7 days. The problem occurs constantly. The problem has been unchanged. Associated symptoms include abdominal pain, coughing and a sore throat. Pertinent negatives include no back pain, blurred vision, fever, nausea or vomiting. Nothing aggravates the symptoms. She has tried nothing for the symptoms.    RN note: Pt tested positive for Covid this past Sunday. Since then has been having sore throat, chills, headache. Comes in this am with ctxs since MN. Denies any LOF or vag bleeding  Past Medical History: Past Medical History:  Diagnosis Date  . Coronavirus infection   . DVT (deep venous thrombosis) ([place])   . Gestational diabetes   . Hypertension   . Miscarriage     Past obstetric history: OB History  Gravida Para Term Preterm  AB Living  5 1 1   3 1   SAB TAB Ectopic Multiple Live Births  1 2     1     # Outcome Date GA Lbr Len/2nd Weight Sex Delivery Anes PTL Lv  5 Current           4 SAB 05/20/17 [redacted]w[redacted]d         3 TAB 10/04/16          2 TAB 08/11/14          1 Term 08/24/07 [redacted]w[redacted]d    CS-LTranv        Complications: LGA (large for gestational age) fetus affecting management of mother    Past Surgical History: Past Surgical History:  Procedure Laterality Date  . CESAREAN SECTION  08/24/07    Family History: Family History  Problem Relation Age of Onset  . Diabetes Mother   . Heart disease Mother   . Immunodeficiency Mother        HIV- deceased at age 79  . Hypertension Mother   . Diabetes Father   . Heart disease Father   . Immunodeficiency Father        HIV- died last year at 66  . Hypertension Father   . Varicose Veins Father   . Heart attack Father     Social History: Social History   Tobacco Use  . Smoking status: Never Smoker  . Smokeless tobacco: Never Used  Substance Use Topics  . Alcohol use: Yes    Alcohol/week: 3.0 standard drinks    Types: 3 Shots of liquor per week  . Drug use: No    Allergies:  Allergies  Allergen Reactions  . Morphine And Related Itching  . Shellfish Allergy Swelling    Meds:  Medications Prior to Admission  Medication Sig Dispense Refill Last Dose  . metformin (FORTAMET) 1000 MG (OSM) 24 hr tablet Take 1,000 mg by mouth 2 (two) times daily with a meal.     . acetaminophen (TYLENOL) 500 MG tablet Take 500 mg by mouth every 6 (six) hours as needed.     Marland Kitchen. Dextromethorphan-guaiFENesin (ROBITUSSIN DM INFANT DROPS PO) Take by mouth.     . fluconazole (DIFLUCAN) 150 MG tablet Take 1 tablet (150 mg total) by mouth daily. 2 tablet 0   . glucose blood test strip FreeStyle Lite Strips     . metroNIDAZOLE (FLAGYL) 500 MG tablet Take 1 tablet (500 mg total) by mouth 2 (two) times daily. 14 tablet 0   . Prenatal Vit-Fe Fumarate-FA (PRENATAL VITAMIN) 27-0.8 MG  TABS Take 1 tablet by mouth daily.       I have reviewed patient's Past Medical Hx, Surgical Hx, Family Hx, Social Hx, medications and allergies.   ROS:  Review of Systems  Constitutional: Negative for fever.  HENT: Positive for sore throat.   Eyes: Negative for blurred vision.  Respiratory: Positive for cough.   Gastrointestinal: Positive for abdominal pain. Negative for constipation, diarrhea, nausea and vomiting.  Genitourinary: Negative for dysuria.  Musculoskeletal: Negative for back pain.  Neurological: Positive for headaches.   Other systems negative  Physical Exam   Patient Vitals for the past 24 hrs:  BP Temp Pulse Resp SpO2 Height Weight  04/23/19 0331 (!) 136/101 -- (!) 105 -- -- -- --  04/23/19 0316 (!) 142/95 -- (!) 118 -- -- -- --  04/23/19 0300 (!) 135/97 -- (!) 129 -- 97 % -- --  04/23/19 0252 (!) 141/95 -- (!) 128 -- -- -- --  04/23/19 0250 -- 98.4 F (36.9 C) -- -- -- -- --  04/23/19 0241 (!) 139/99 -- (!) 118 18 97 % 5\' 7"  (1.702 m) 93 kg   Vitals:   04/23/19 0531 04/23/19 0546 04/23/19 0601 04/23/19 0616  BP: (!) 123/105 (!) 132/95 140/87 (!) 150/101  Pulse: (!) 113 (!) 104 (!) 105 (!) 107  Resp:      Temp:      SpO2:      Weight:      Height:        Constitutional: Well-developed, well-nourished female in no acute distress.  Cardiovascular: normal rate and rhythm Respiratory: normal effort, clear to auscultation bilaterally GI: Abd soft, non-tender, gravid appropriate for gestational age.   No rebound or guarding. MS: Extremities nontender, no edema, normal ROM Neurologic: Alert and oriented x 4.  GU: Neg CVAT.  PELVIC EXAM:  Dilation: 2 Effacement (%): 60, 70 Cervical Position: Posterior Station: -2 Presentation: Vertex Exam by:: Bascom Biel CNM Bloody show  Repeat exam unchanged but increased show  FHT:  Baseline 140 , moderate variability, occasional small accelerations present, shallow early decelerations Contractions: q 6 mins  Irregular     Labs: Results for orders placed or performed during the hospital encounter of 04/23/19 (from the past 24 hour(s))  CBC     Status: Abnormal   Collection Time: 04/23/19  3:43 AM  Result Value Ref Range   WBC 3.7 (L) 4.0 - 10.5 K/uL   RBC 4.79 3.87 - 5.11 MIL/uL   Hemoglobin 14.7 12.0 - 15.0 g/dL   HCT 13.042.9 86.536.0 - 78.446.0 %   MCV 89.6 80.0 - 100.0 fL  MCH 30.7 26.0 - 34.0 pg   MCHC 34.3 30.0 - 36.0 g/dL   RDW 11.5 72.6 - 20.3 %   Platelets 147 (L) 150 - 400 K/uL   nRBC 0.0 0.0 - 0.2 %  Comprehensive metabolic panel     Status: Abnormal   Collection Time: 04/23/19  3:43 AM  Result Value Ref Range   Sodium 136 135 - 145 mmol/L   Potassium 4.1 3.5 - 5.1 mmol/L   Chloride 106 98 - 111 mmol/L   CO2 20 (L) 22 - 32 mmol/L   Glucose, Bld 116 (H) 70 - 99 mg/dL   BUN 7 6 - 20 mg/dL   Creatinine, Ser 5.59 0.44 - 1.00 mg/dL   Calcium 9.0 8.9 - 74.1 mg/dL   Total Protein 6.0 (L) 6.5 - 8.1 g/dL   Albumin 2.6 (L) 3.5 - 5.0 g/dL   AST 42 (H) 15 - 41 U/L   ALT 29 0 - 44 U/L   Alkaline Phosphatase 165 (H) 38 - 126 U/L   Total Bilirubin 0.7 0.3 - 1.2 mg/dL   GFR calc non Af Amer >60 >60 mL/min   GFR calc Af Amer >60 >60 mL/min   Anion gap 10 5 - 15  Protein / creatinine ratio, urine     Status: Abnormal   Collection Time: 04/23/19  4:10 AM  Result Value Ref Range   Creatinine, Urine 137.27 mg/dL   Total Protein, Urine 147 mg/dL   Protein Creatinine Ratio 1.07 (H) 0.00 - 0.15 mg/mg[Cre]     Imaging:  BPP done:  6/8 (2 off for breathing)  MAU Course/MDM: I have ordered labs and reviewed results. Preeclampsia labs ordered >  Significant for elevated (for pregnancy) Creatinine (0.97), slightly low platelets (147), and very slightly elevated AST (42) NST reviewed, has been mostly nonreactive with shallow early decels, rare accels, Category 2 Consult Dr Jackelyn Knife with presentation, exam findings and test results.  Will proceed with Cesarean Delivery, likely under General  Anesthesia due to Lovenox dose last night Last ate at 8pm  Assessment: Single intrauterine pregnancy at [redacted]w[redacted]d Uterine contractions  Covid-19 Preeclampsia  Plan: Prep for Cesarean Delivery   Wynelle Bourgeois CNM, MSN Certified Nurse-Midwife 04/23/2019 3:44 AM

## 2019-04-23 NOTE — H&P (Signed)
Ashley Rush is a 33 y.o. female G5P1031 at 37+ in labor - ctx, cervical change, PIH.  +FM, no LOF, no VB, ctx q 6-57min.  Recently diagnosed w Covid - has ST, runny nose and mild cough.  Has been receiving Lovenox for prophylaxis.  Last dose 9pm.  D/W pt r/b/a of rLTCS will proceed.  Normal first trimester screen   OB History    Gravida  5   Para  1   Term  1   Preterm      AB  3   Living  1     SAB  1   TAB  2   Ectopic      Multiple      Live Births  1         TAB x 2, SAB LTCS arrest of dilation  No abn pap No STD  Past Medical History:  Diagnosis Date  . Coronavirus infection   . DVT (deep venous thrombosis) ([facility])   . Gestational diabetes   . Hypertension   . Miscarriage    Past Surgical History:  Procedure Laterality Date  . CESAREAN SECTION  08/24/07   Family History: family history includes Diabetes in her father and mother; Heart attack in her father; Heart disease in her father and mother; Hypertension in her father and mother; Immunodeficiency in her father and mother; Varicose Veins in her father. Social History:  reports that she has never smoked. She has never used smokeless tobacco. She reports current alcohol use of about 3.0 standard drinks of alcohol per week. She reports that she does not use drugs. Meds: Metformin 1000bid, PNV FBP:ZWCH    Maternal Diabetes: Yes:  Diabetes Type:  Insulin/Medication controlled Genetic Screening: Declined Maternal Ultrasounds/Referrals: Normal Fetal Ultrasounds or other Referrals:  None Maternal Substance Abuse:  No Significant Maternal Medications:  None Significant Maternal Lab Results:  None Other Comments:  COVID + mother  Review of Systems  Constitutional: Positive for chills.  HENT: Positive for rhinorrhea, sinus pain and sore throat.   Eyes: Negative.   Respiratory: Positive for cough.   Cardiovascular: Negative.   Gastrointestinal:       Contractions  Endocrine: Negative.    Genitourinary: Negative.   Musculoskeletal: Negative.   Skin: Negative.   Neurological: Negative.   Psychiatric/Behavioral: Negative.    Maternal Medical History:  Reason for admission: Rupture of membranes and contractions.   Fetal activity: Perceived fetal activity is normal.    Prenatal complications: PIH.   COVID +  Prenatal Complications - Diabetes: none.    Dilation: 2 Effacement (%): 60, 70 Station: -2 Exam by:: Williams CNM Blood pressure (!) 144/107, pulse (!) 105, temperature 98.4 F (36.9 C), resp. rate 18, height 5\' 7"  (1.702 m), weight 93 kg, last menstrual period 07/05/2018, SpO2 97 %. Maternal Exam:  Abdomen: Patient reports no abdominal tenderness. Fundal height is appropriate for gestational age.   Estimated fetal weight is 7.5#.   Fetal presentation: vertex  Introitus: Normal vulva. Normal vagina.    Physical Exam  Constitutional: She is oriented to person, place, and time. She appears well-developed and well-nourished.  HENT:  Head: Normocephalic and atraumatic.  Cardiovascular: Normal rate and regular rhythm.  Respiratory: Breath sounds normal. No respiratory distress. She has no wheezes.  GI: Soft. Bowel sounds are normal. She exhibits no distension. There is no abdominal tenderness.  Genitourinary:    Vulva normal.   Musculoskeletal:        General: Normal range of  motion.  Neurological: She is alert and oriented to person, place, and time.  Skin: Skin is warm and dry.  Psychiatric: She has a normal mood and affect. Her behavior is normal.    Prenatal labs: ABO, Rh:  O+ Antibody:  neg Rubella:  immune RPR:   NR HBsAg:   NR HIV:   neg GBS:   unknown  PHgb 13.9/Plt 223/Ur Cx neg/ GC neg/Chl neg/Varicella immune/CF neg, SMA neg, fragile X neg/Hep C neg/glucola 189 - 3hr GDM/first trimester screen WNL    Assessment/Plan: 33yo [id number] at 37+ in labor with COVID + For rLTCS, d/w pt r/b/a Ancef for prophylaxis Sugars controlled - will  monitor   Ashley Rush 04/23/2019, 7:44 AM

## 2019-04-23 NOTE — Anesthesia Procedure Notes (Signed)
Spinal  Patient location during procedure: OR Staffing Performed: anesthesiologist  Anesthesiologist: Gladie Gravette E, MD Preanesthetic Checklist Completed: patient identified, IV checked, risks and benefits discussed, surgical consent, monitors and equipment checked, pre-op evaluation and timeout performed Spinal Block Patient position: sitting Prep: DuraPrep and site prepped and draped Patient monitoring: continuous pulse ox, blood pressure and heart rate Approach: midline Location: L3-4 Injection technique: single-shot Needle Needle type: Pencan  Needle gauge: 24 G Needle length: 9 cm Additional Notes Functioning IV was confirmed and monitors were applied. Sterile prep and drape, including hand hygiene and sterile gloves were used. The patient was positioned and the spine was prepped. The skin was anesthetized with lidocaine.  Free flow of clear CSF was obtained prior to injecting local anesthetic into the CSF. The needle was carefully withdrawn. The patient tolerated the procedure well.      

## 2019-04-23 NOTE — MAU Note (Signed)
Pt tested positive for Covid this past Sunday. Since then has been having sore throat, chills, headache. Comes in this am with ctxs since MN. Denies any LOF or vag bleeding

## 2019-04-23 NOTE — MAU Note (Signed)
[place] called for bedside BPP. Currently in NICU and will come down when finished there. Aware pt is Covid +

## 2019-04-23 NOTE — Brief Op Note (Signed)
04/23/2019  10:39 AM  PATIENT:  Ashley Rush  33 y.o. female  PRE-OPERATIVE DIAGNOSIS:  previous cesarean section, laboring, ROM  POST-OPERATIVE DIAGNOSIS:  previous cesarean section, laboring, ROM, delivered  PROCEDURE:  Procedure(s): CESAREAN SECTION (N/A)  SURGEON:  Surgeon(s) and Role:    * Bovard-Stuckert, Dawsyn Ramsaran, MD - Primary  ASSISTANTS: Maida Sale RNFA   ANESTHESIA:   spinal  EBL:    uop and IVF per anesthesia, clear urine at end of procedure.  FINDINGS: viable female infant at 9:24, apgars P, wt P; multiple adhesions to ant abdominal wall, multiple uterine adhesions.    BLOOD ADMINISTERED:none  DRAINS: Urinary Catheter (Foley)   LOCAL MEDICATIONS USED:  NONE  SPECIMEN:  Source of Specimen:  Placenta  DISPOSITION OF SPECIMEN:  PATHOLOGY  COUNTS:  YES  TOURNIQUET:  * No tourniquets in log *  DICTATION: .Other Dictation: Dictation Number [id number]  PLAN OF CARE: Admit to inpatient   PATIENT DISPOSITION:  PACU - hemodynamically stable.   Delay start of Pharmacological VTE agent (>24hrs) due to surgical blood loss or risk of bleeding: not applicable

## 2019-04-23 NOTE — Anesthesia Preprocedure Evaluation (Signed)
Anesthesia Evaluation  Patient identified by MRN, date of birth, ID band Patient awake    Reviewed: Allergy & Precautions, H&P , NPO status , Patient's Chart, lab work & pertinent test results  History of Anesthesia Complications Negative for: history of anesthetic complications  Airway Mallampati: II  TM Distance: >3 FB Neck ROM: full    Dental no notable dental hx.    Pulmonary neg pulmonary ROS,    Pulmonary exam normal        Cardiovascular hypertension (pre-eclampsia), + DVT (remote hx; on prophylaxis dose Lovenox; last dose 12/17 @ 2100)  Normal cardiovascular exam     Neuro/Psych negative neurological ROS  negative psych ROS   GI/Hepatic negative GI ROS, Neg liver ROS,   Endo/Other  diabetes, Gestational  Renal/GU negative Renal ROS  negative genitourinary   Musculoskeletal   Abdominal   Peds  Hematology negative hematology ROS (+)   Anesthesia Other Findings SYMPTOMATIC COVID-19 INFECTION  Reproductive/Obstetrics (+) Pregnancy                             Anesthesia Physical Anesthesia Plan  ASA: III and emergent  Anesthesia Plan: Spinal   Post-op Pain Management:    Induction:   PONV Risk Score and Plan: Ondansetron and Treatment may vary due to age or medical condition  Airway Management Planned:   Additional Equipment:   Intra-op Plan:   Post-operative Plan:   Informed Consent: I have reviewed the patients History and Physical, chart, labs and discussed the procedure including the risks, benefits and alternatives for the proposed anesthesia with the patient or authorized representative who has indicated his/her understanding and acceptance.       Plan Discussed with:   Anesthesia Plan Comments:         Anesthesia Quick Evaluation

## 2019-04-24 LAB — CBC
HCT: 39 % (ref 36.0–46.0)
Hemoglobin: 13.1 g/dL (ref 12.0–15.0)
MCH: 30.7 pg (ref 26.0–34.0)
MCHC: 33.6 g/dL (ref 30.0–36.0)
MCV: 91.3 fL (ref 80.0–100.0)
Platelets: 148 10*3/uL — ABNORMAL LOW (ref 150–400)
RBC: 4.27 MIL/uL (ref 3.87–5.11)
RDW: 13.9 % (ref 11.5–15.5)
WBC: 4.9 10*3/uL (ref 4.0–10.5)
nRBC: 0 % (ref 0.0–0.2)

## 2019-04-24 LAB — RPR: RPR Ser Ql: NONREACTIVE

## 2019-04-24 MED ORDER — DIPHENHYDRAMINE HCL 50 MG/ML IJ SOLN: 25.0000 mg | Freq: Once | INTRAMUSCULAR | Status: DC

## 2019-04-24 MED ORDER — FAMOTIDINE 20 MG PO TABS: 40.0000 mg | ORAL_TABLET | Freq: Every day | ORAL | Status: DC

## 2019-04-24 MED ORDER — DEXAMETHASONE SODIUM PHOSPHATE 10 MG/ML IJ SOLN: 10.0000 mg | Freq: Once | INTRAMUSCULAR | Status: DC

## 2019-04-24 NOTE — Progress Notes (Signed)
Subjective: Postpartum Day 1: Cesarean Delivery Patient reports incisional pain, tolerating PO and no problems voiding.   Symptoms are improving.  10days after + COVID is 12/19  Objective: Vital signs in last 24 hours: Temp:  [97.4 F (36.3 C)-98.5 F (36.9 C)] 98 F (36.7 C) (12/19 0400) Pulse Rate:  [79-108] 87 (12/19 0400) Resp:  [17-29] 20 (12/19 0707) BP: (113-154)/(76-100) 116/85 (12/19 0400) SpO2:  [93 %-99 %] 96 % (12/19 0400)  Physical Exam:  General: alert and no distress Lochia: appropriate Uterine Fundus: firm Incision: healing well DVT Evaluation: No evidence of DVT seen on physical exam.  Recent Labs    04/23/19 0343 04/24/19 0812  HGB 14.7 13.1  HCT 42.9 39.0    Assessment/Plan: Status post Cesarean section. Doing well postoperatively.  Continue current care.  Jerrit Horen Bovard-Stuckert 04/24/2019, 10:15 AM

## 2019-04-25 ENCOUNTER — Inpatient Hospital Stay (HOSPITAL_COMMUNITY): Payer: 59

## 2019-04-25 DIAGNOSIS — Z98891 History of uterine scar from previous surgery: Secondary | ICD-10-CM

## 2019-04-25 DIAGNOSIS — U071 COVID-19: Secondary | ICD-10-CM

## 2019-04-25 DIAGNOSIS — J069 Acute upper respiratory infection, unspecified: Secondary | ICD-10-CM

## 2019-04-25 LAB — COMPREHENSIVE METABOLIC PANEL
ALT: 41 U/L (ref 0–44)
AST: 67 U/L — ABNORMAL HIGH (ref 15–41)
Albumin: 2.3 g/dL — ABNORMAL LOW (ref 3.5–5.0)
Alkaline Phosphatase: 110 U/L (ref 38–126)
Anion gap: 8 (ref 5–15)
BUN: 10 mg/dL (ref 6–20)
CO2: 22 mmol/L (ref 22–32)
Calcium: 8.4 mg/dL — ABNORMAL LOW (ref 8.9–10.3)
Chloride: 106 mmol/L (ref 98–111)
Creatinine, Ser: 1.05 mg/dL — ABNORMAL HIGH (ref 0.44–1.00)
GFR calc Af Amer: >60 mL/min (ref >60)
GFR calc non Af Amer: >60 mL/min (ref >60)
Glucose, Bld: 83 mg/dL (ref 70–99)
Potassium: 4.1 mmol/L (ref 3.5–5.1)
Sodium: 136 mmol/L (ref 135–145)
Total Bilirubin: 0.2 mg/dL — ABNORMAL LOW (ref 0.3–1.2)
Total Protein: 5.7 g/dL — ABNORMAL LOW (ref 6.5–8.1)

## 2019-04-25 LAB — CBC
HCT: 38.3 % (ref 36.0–46.0)
Hemoglobin: 12.8 g/dL (ref 12.0–15.0)
MCH: 30.3 pg (ref 26.0–34.0)
MCHC: 33.4 g/dL (ref 30.0–36.0)
MCV: 90.5 fL (ref 80.0–100.0)
Platelets: 153 10*3/uL (ref 150–400)
RBC: 4.23 MIL/uL (ref 3.87–5.11)
RDW: 13.6 % (ref 11.5–15.5)
WBC: 6.8 10*3/uL (ref 4.0–10.5)
nRBC: 0 % (ref 0.0–0.2)

## 2019-04-25 LAB — C-REACTIVE PROTEIN: CRP: 9.9 mg/dL — ABNORMAL HIGH (ref <1.0)

## 2019-04-25 LAB — PROCALCITONIN: Procalcitonin: 0.32 ng/mL

## 2019-04-25 LAB — LACTATE DEHYDROGENASE: LDH: 238 U/L — ABNORMAL HIGH (ref 98–192)

## 2019-04-25 LAB — FERRITIN: Ferritin: 197 ng/mL (ref 11–307)

## 2019-04-25 LAB — FIBRINOGEN: Fibrinogen: 625 mg/dL — ABNORMAL HIGH (ref 210–475)

## 2019-04-25 MED ORDER — IOHEXOL 350 MG/ML SOLN
100.0000 mL | Freq: Once | INTRAVENOUS | Status: AC | PRN
  Administered 2019-04-25: 65 mL via INTRAVENOUS

## 2019-04-25 MED ORDER — METHYLPREDNISOLONE SODIUM SUCC 125 MG IJ SOLR
0.5000 mg/kg | Freq: Two times a day (BID) | INTRAMUSCULAR | Status: DC
  Administered 2019-04-25 – 2019-04-27 (×4): 46.25 mg via INTRAVENOUS
  Filled 2019-04-25 (×4): qty 2

## 2019-04-25 MED ORDER — SODIUM CHLORIDE 0.9 % IV SOLN
3.0000 g | Freq: Four times a day (QID) | INTRAVENOUS | Status: DC
  Administered 2019-04-25 – 2019-04-27 (×7): 3 g via INTRAVENOUS
  Filled 2019-04-25 (×7): qty 8

## 2019-04-25 MED ORDER — ENOXAPARIN SODIUM 80 MG/0.8ML ~~LOC~~ SOLN
1.0000 mg/kg | Freq: Two times a day (BID) | SUBCUTANEOUS | Status: AC
  Administered 2019-04-26 (×2): 75 mg via SUBCUTANEOUS
  Filled 2019-04-25 (×2): qty 0.8

## 2019-04-25 MED ORDER — SODIUM CHLORIDE 0.9 % IV SOLN
200.0000 mg | Freq: Once | INTRAVENOUS | Status: AC
  Administered 2019-04-25: 200 mg via INTRAVENOUS
  Filled 2019-04-25: qty 40

## 2019-04-25 MED ORDER — SODIUM CHLORIDE 0.9 % IV SOLN
100.0000 mg | Freq: Every day | INTRAVENOUS | Status: DC
  Administered 2019-04-26 – 2019-04-27 (×2): 100 mg via INTRAVENOUS
  Filled 2019-04-25: qty 100
  Filled 2019-04-25: qty 20

## 2019-04-25 MED ORDER — SODIUM CHLORIDE 0.9 % IV SOLN
2.0000 g | Freq: Two times a day (BID) | INTRAVENOUS | Status: DC
  Administered 2019-04-25: 2 g via INTRAVENOUS
  Filled 2019-04-25 (×2): qty 2

## 2019-04-25 MED ORDER — SODIUM CHLORIDE 0.9 % IV SOLN: 500.0000 mg | INTRAVENOUS | Status: DC

## 2019-04-25 MED ORDER — ENOXAPARIN SODIUM 60 MG/0.6ML ~~LOC~~ SOLN
50.0000 mg | SUBCUTANEOUS | Status: DC
  Administered 2019-04-25: 50 mg via SUBCUTANEOUS
  Filled 2019-04-25: qty 0.6

## 2019-04-25 NOTE — Progress Notes (Signed)
Patient ID: Ashley Rush, female   DOB: 1985-10-13, 33 y.o.   MRN: [id number]  COVID orders came through Pt to not take NSAIDS D/w Dr Lorin Mercy - OK if short course, will d/c if worsening

## 2019-04-25 NOTE — Progress Notes (Signed)
ANTICOAGULATION CONSULT NOTE - Initial Consult  Pharmacy Consult for enoxaparin Indication: Post partum DVT Px  Allergies  Allergen Reactions  . Morphine And Related Itching  . Shellfish Allergy Swelling    Patient Measurements: Height: 5\' 7"  (170.2 cm) Weight: 205 lb (93 kg) IBW/kg (Calculated) : 61.6 Heparin Dosing Weight: 74 kg  Vital Signs: Temp: 98.8 F (37.1 C) (12/20 2349) Temp Source: Oral (12/20 2349) BP: 144/91 (12/20 2349) Pulse Rate: 122 (12/20 2349)  Labs: Recent Labs    04/23/19 0343 04/24/19 0812 04/25/19 1253 04/25/19 1639  HGB 14.7 13.1 12.8  --   HCT 42.9 39.0 38.3  --   PLT 147* 148* 153  --   CREATININE 0.97  --   --  1.05*    Estimated Creatinine Clearance: 88.4 mL/min (A) (by C-G formula based on SCr of 1.05 mg/dL (H)).   Medical History: Past Medical History:  Diagnosis Date  . Coronavirus infection   . DVT (deep venous thrombosis) ([place])   . Gestational diabetes   . Hypertension   . Miscarriage     Medications:  Scheduled:  . acetaminophen  1,000 mg Oral Q6H  . enoxaparin (LOVENOX) injection  1 mg/kg (Adjusted) Subcutaneous Q12H  . ibuprofen  800 mg Oral Q8H  . methylPREDNISolone (SOLU-MEDROL) injection  0.5 mg/kg Intravenous Q12H  . prenatal multivitamin  1 tablet Oral Q1200  . scopolamine  1 patch Transdermal Once  . senna-docusate  2 tablet Oral Q24H  . simethicone  80 mg Oral TID PC  . simethicone  80 mg Oral Q24H  . Tdap  0.5 mL Intramuscular Once    Assessment: 33 yo covid +, postpartum patient with history of DVT/PE.  Pharmacy has been consulted for enoxaparin dosing.   Goal of Therapy:  Anti-Xa level 0.6-1 units/ml 4hrs after LMWH dose given Monitor platelets by anticoagulation protocol: Yes   Plan:  Will do full dose due to history of VTE. Lovenox 1mg /kg SQ Q12 hours using adjusted body weight of 74 kg.    Zarin Knupp Scarlett 04/26/2019,12:19 AM

## 2019-04-25 NOTE — Progress Notes (Addendum)
Patient ID: Ashley Rush, female   DOB: 05-18-1985, 33 y.o.   MRN: [id number]  D/W pharmacy, for both chorio and covid coverage will give Unasyn Lovenox is at prophylactic dose, d/w consultant will have pharmacy increase to full dose.

## 2019-04-25 NOTE — Plan of Care (Signed)
  Problem: Education: Goal: Knowledge of General Education information will improve Description: Including pain rating scale, medication(s)/side effects and non-pharmacologic comfort measures Outcome: Completed/Met

## 2019-04-25 NOTE — Progress Notes (Signed)
Pharmacy Note: Remdesivir Dosing   O: ALT: 41 U/L COVID positive at outside facility 04/18/2019  AP:  Patient meets criteria for remdesivir. Begin remdesivir 200 mg IV x 1, followed by 100 mg IV daily x 4 days  Will continue to monitor ALT and clinical progress.  Thank you for allowing pharmacy to be a part of this patient's care.    Yolanda Bonine, PharmD 04/25/2019 20:30:00

## 2019-04-25 NOTE — Progress Notes (Signed)
Dr. Melba Coon notified of pt's temp 100.4 and lower abdominal tenderness upon palpation. Orders received for iv abx and cbc.

## 2019-04-25 NOTE — Progress Notes (Signed)
Subjective: Postpartum Day 2: Cesarean Delivery Patient reports incisional pain, tolerating PO and no problems voiding.  Nl lochia, pain controlled Sx's improved, BP controlled  Objective: Vital signs in last 24 hours: Temp:  [97.6 F (36.4 C)-98.4 F (36.9 C)] 98.3 F (36.8 C) (12/20 0607) Pulse Rate:  [86-110] 99 (12/20 0607) Resp:  [18-24] 23 (12/20 0607) BP: (122-145)/(75-95) 145/95 (12/20 0607) SpO2:  [96 %-99 %] 96 % (12/20 0037)  Physical Exam:  General: alert and no distress Lochia: appropriate Uterine Fundus: firm, app tender Incision: healing well DVT Evaluation: No evidence of DVT seen on physical exam.  Recent Labs    04/23/19 0343 04/24/19 0812  HGB 14.7 13.1  HCT 42.9 39.0    Assessment/Plan: Status post Cesarean section. Doing well postoperatively.  Continue current care.  Malikai Gut Bovard-Stuckert 04/25/2019, 8:48 AM

## 2019-04-25 NOTE — Progress Notes (Addendum)
Patient ID: Ashley Rush, female   DOB: 1986-02-22, 33 y.o.   MRN: [id number]   COVID +/s/p rLTCS POD #2/h/o DVT/PE on Lovenox/PIH s/p Magnesium   Pt with tender fundus and T = 100.4.  Had some SOB with getting up, but now feeling better.    Tm= 100.4, P 99-110, R 20-23, BP 123-145/73-95 (138/88) pulse ox 96% gen NAD CV: RRR Lungs: CTA, decreased breath sounds at bases Abd: soft, FF some tenderness Ext: SCDs in place  Inc: C/D/I  CBC 6.8(inc from 4.9, 3.7)\12.8 (dec 14.7)/153  Restart Lovenox Encourage IS Start Cefotetan 2g IV q 12 - likely Triple I Pt states feels better after ambulation and IS  Will get CXR Also call to Hospitalists - Dr Lorin Mercy (Inflam marker of covid)  CTA r/o PE/pneumonia

## 2019-04-25 NOTE — Progress Notes (Signed)
Patient ID: Ashley Rush, female   DOB: Mar 07, 1986, 33 y.o.   MRN: [id number]  CRP elevated - sign of infection CTA - no large PE - respiratory artifact, B bibasilar infiltrate - c/w pneumonia, R Pulmonary Effusion, bibasilar atelectasis Mild elevation LFT Plt WNL  Continued tachycardia, pulse ox 96%, BP WNL

## 2019-04-25 NOTE — Consult Note (Signed)
Medical Consultation   Ashley Rush  TIW:[id number]  DOB: Sep 16, 1985  DOA: 04/23/2019  PCP: Antony Blackbird, MD   Requesting physician: Bovard-Stuckert - OB/GYN  Reason for consultation: Admitted in labor for LTCS, with COVID.  Temp 100.4, ?chorioamnionitis.  Has h/o DVT/PE, off Lovenox Friday and Saturday.  Given Lovenox, started on antibiotics, getting CXR.  96% on RA.   History of Present Illness: Ashley Rush is an 33 y.o. female with h/o HTN and COVID-19 infection diagnosed 12/11 (quarantine ends tomorrow).  She presented to [facility] on 12/18 as a [id number] at 37+1 due to contractions, and had a repeat LTCS later that day.  She reports that her nephew died recently and she attended his funeral.  Since then, 6 family members have developed COVID-19 infections.  She developed symptoms about 12/11 and tested positive on 12/13.  She went into labor on 12/18 and had a LTCS.  She continues to feel mildly SOB with ambulation, but attributes this to lying in bed too much.  She has intermittent cough.  She developed fever today.  She has mild but appropriate post-operative pain without apparent drainage from her incision.    Review of Systems:  ROS As per HPI otherwise 10 point review of systems negative.    Past Medical History: Past Medical History:  Diagnosis Date  . Coronavirus infection   . DVT (deep venous thrombosis) ([place])   . Gestational diabetes   . Hypertension   . Miscarriage     Past Surgical History: Past Surgical History:  Procedure Laterality Date  . CESAREAN SECTION  08/24/07  . CESAREAN SECTION N/A 04/23/2019   Procedure: CESAREAN SECTION;  Surgeon: Janyth Contes, MD;  Location: [facility];  Service: Obstetrics;  Laterality: N/A;     Allergies:   Allergies  Allergen Reactions  . Morphine And Related Itching  . Shellfish Allergy Swelling     Social History:  reports that she has never smoked. She has never used smokeless  tobacco. She reports current alcohol use of about 3.0 standard drinks of alcohol per week. She reports that she does not use drugs.   Family History: Family History  Problem Relation Age of Onset  . Diabetes Mother   . Heart disease Mother   . Immunodeficiency Mother        HIV- deceased at age 72  . Hypertension Mother   . Diabetes Father   . Heart disease Father   . Immunodeficiency Father        HIV- died last year at 27  . Hypertension Father   . Varicose Veins Father   . Heart attack Father       Physical Exam: Vitals:   04/25/19 1200 04/25/19 1232 04/25/19 1436 04/25/19 1608  BP:  138/88  117/85  Pulse:  (!) 105  (!) 115  Resp: 18 (!) 21  20  Temp:  (!) 100.4 F (38 C) 99.4 F (37.4 C) 99.2 F (37.3 C)  TempSrc:  Oral Oral Oral  SpO2:  96%  96%  Weight:      Height:        Constitutional: Alert and awake, oriented x3, not in any acute distress. Eyes: EOMI, irises appear normal, anicteric sclera,  ENMT: external ears and nose appear normal, normal hearing, Lips appear normal Neck: neck appears normal, no masses, normal ROM  CVS: S1-S2 clear, no murmur rubs or gallops, no LE edema,  normal pedal pulses  Respiratory:  clear to auscultation bilaterally, no wheezing, rales or rhonchi. Respiratory effort normal to minimally increased. Abdomen: soft appropriately tender, incisional dressing in place without apparent issue Musculoskeletal: : no cyanosis, clubbing or edema noted bilaterally Neuro: Cranial nerves II-XII intact, strength, sensation, reflexes Psych: judgement and insight appear normal, stable mood and affect, mental status Skin: no rashes or lesions or ulcers, no induration or nodules    Data reviewed:  I have personally reviewed the recent labs and imaging studies  Pertinent Labs:   Normal CBC Albumin 2.3 LDH 238 Ferritin 197 CRP 9.9 Procalcitonin 0.32 Fibrinogen 625   Inpatient Medications:   Scheduled Meds: . acetaminophen  1,000 mg Oral  Q6H  . enoxaparin (LOVENOX) injection  50 mg Subcutaneous Q24H  . ibuprofen  800 mg Oral Q8H  . prenatal multivitamin  1 tablet Oral Q1200  . scopolamine  1 patch Transdermal Once  . senna-docusate  2 tablet Oral Q24H  . simethicone  80 mg Oral TID PC  . simethicone  80 mg Oral Q24H  . Tdap  0.5 mL Intramuscular Once   Continuous Infusions: . cefoTEtan (CEFOTAN) IV 2 g (04/25/19 1323)  . lactated ringers Stopped (04/24/19 0927)  . naLOXone [facility]) adult infusion for PRURITIS       Radiological Exams on Admission: CT ANGIO CHEST PE W OR WO CONTRAST  Result Date: 04/25/2019 CLINICAL DATA:  33 year old female with acute shortness of breath. COVID positive. EXAM: CT ANGIOGRAPHY CHEST WITH CONTRAST TECHNIQUE: Multidetector CT imaging of the chest was performed using the standard protocol during bolus administration of intravenous contrast. Multiplanar CT image reconstructions and MIPs were obtained to evaluate the vascular anatomy. CONTRAST:  40mL OMNIPAQUE IOHEXOL 350 MG/ML SOLN COMPARISON:  None. FINDINGS: Cardiovascular: This is a technically borderline study given moderate respiratory motion artifact. No large/central pulmonary emboli are identified. UPPER limits normal heart size identified. No pericardial effusion or thoracic aortic aneurysm. Mediastinum/Nodes: No enlarged mediastinal, hilar, or axillary lymph nodes. Thyroid gland, trachea, and esophagus demonstrate no significant findings. Lungs/Pleura: Scattered bilateral patchy ground-glass and airspace opacities are noted compatible with infection. A trace RIGHT pleural effusion and mild bibasilar atelectasis identified. No pneumothorax identified. Upper Abdomen: Unremarkable Musculoskeletal: No acute or suspicious bony abnormality identified. Review of the MIP images confirms the above findings. IMPRESSION: 1. Technically borderline study for pulmonary emboli given moderate respiratory motion artifact. No large/central pulmonary emboli  identified. 2. Scattered bilateral patchy ground-glass and airspace opacities compatible with infection/pneumonia. 3. Trace RIGHT pleural effusion and mild bibasilar atelectasis. Electronically Signed   By: Harmon Pier M.D.   On: 04/25/2019 18:10    Impression/Recommendations Active Problems:   Status post repeat low transverse cesarean section   Acute respiratory disease due to COVID-19 virus  S/p LTCS -Patient is progressing well from an OB/GYN standpoint -Management per OB/GYN  Acute respiratory disease associated with COVID-19 infection -Patient with mild cough, SOB with ambulation, and fever -She does not have an O2 requirement at this time -COVID POSITIVE -With her fever and symptoms today, TRH was consulted; labs and CTA are concerning for COVID-associated PNA -Pertinent labs concerning for COVID include normal WBC count;increased LFTs; markedly elevated CRP (>7); increased LDH; increased fibrinogen - while some of these markers may be associated with pregnancy (and this is why D-dimer was not checked), it appears that she has COVID-associated inflammation -CT chest ground glass opacities c/o COVID without apparent PE -Will treat with broad-spectrum antibiotics given procalcitonin >0.1; she has been started  on Cefotetan - Rocephin/Azithro is likely appropriate from a COVID standpoint but will defer to OB/GYN regarding possible need for coverage of chorioamnionitis -At this time, will attempt to avoid use of aerosolized medications and use HFAs instead if needed -Will check daily labs including BMP with Mag, Phos; LFTs; CBC with differential; CRP; ferritin; fibrinogen; D-dimer -Will order steroids (1 mg/kg divided BID) and Remdesivir (pharmacy consult) given +COVID test, +CXR -If the patient shows clinical deterioration, consider transfer to ICU with PCCM consultation -Will attempt to maintain euvolemia to a net negative fluid status -Patient was seen wearing full PPE including: gown,  gloves, head cover, N95, and face shield; donning and doffing was in compliance with current standards.  H/o DVT/PE -On full-dose Lovenox -missed 2 days, but no apparent PE on CTA and symptoms more suggestive of COVID-19 infection -Resume Lovenox     Thank you for this consultation.  [facility] hospital[facility] team will co-manage the patient with you.   Time Spent: 50 minutes  Jonah BlueJennifer Angeli Demilio M.D. [facility] 04/25/2019, 7:06 PM

## 2019-04-26 LAB — CBC WITH DIFFERENTIAL/PLATELET
Abs Immature Granulocytes: 0.08 10*3/uL — ABNORMAL HIGH (ref 0.00–0.07)
Basophils Absolute: 0 10*3/uL (ref 0.0–0.1)
Basophils Relative: 0 %
Eosinophils Absolute: 0 10*3/uL (ref 0.0–0.5)
Eosinophils Relative: 0 %
HCT: 36.3 % (ref 36.0–46.0)
Hemoglobin: 12.2 g/dL (ref 12.0–15.0)
Immature Granulocytes: 1 %
Lymphocytes Relative: 8 %
Lymphs Abs: 0.5 10*3/uL — ABNORMAL LOW (ref 0.7–4.0)
MCH: 30.3 pg (ref 26.0–34.0)
MCHC: 33.6 g/dL (ref 30.0–36.0)
MCV: 90.1 fL (ref 80.0–100.0)
Monocytes Absolute: 0.1 10*3/uL (ref 0.1–1.0)
Monocytes Relative: 2 %
Neutro Abs: 5.3 10*3/uL (ref 1.7–7.7)
Neutrophils Relative %: 89 %
Platelets: 179 10*3/uL (ref 150–400)
RBC: 4.03 MIL/uL (ref 3.87–5.11)
RDW: 13.7 % (ref 11.5–15.5)
WBC: 6 10*3/uL (ref 4.0–10.5)
nRBC: 0 % (ref 0.0–0.2)

## 2019-04-26 LAB — COMPREHENSIVE METABOLIC PANEL
ALT: 72 U/L — ABNORMAL HIGH (ref 0–44)
AST: 120 U/L — ABNORMAL HIGH (ref 15–41)
Albumin: 2.3 g/dL — ABNORMAL LOW (ref 3.5–5.0)
Alkaline Phosphatase: 111 U/L (ref 38–126)
Anion gap: 10 (ref 5–15)
BUN: 10 mg/dL (ref 6–20)
CO2: 21 mmol/L — ABNORMAL LOW (ref 22–32)
Calcium: 8.4 mg/dL — ABNORMAL LOW (ref 8.9–10.3)
Chloride: 107 mmol/L (ref 98–111)
Creatinine, Ser: 0.88 mg/dL (ref 0.44–1.00)
GFR calc Af Amer: >60 mL/min (ref >60)
GFR calc non Af Amer: >60 mL/min (ref >60)
Glucose, Bld: 113 mg/dL — ABNORMAL HIGH (ref 70–99)
Potassium: 4.6 mmol/L (ref 3.5–5.1)
Sodium: 138 mmol/L (ref 135–145)
Total Bilirubin: 0.5 mg/dL (ref 0.3–1.2)
Total Protein: 5.7 g/dL — ABNORMAL LOW (ref 6.5–8.1)

## 2019-04-26 LAB — LACTATE DEHYDROGENASE: LDH: 268 U/L — ABNORMAL HIGH (ref 98–192)

## 2019-04-26 LAB — FERRITIN: Ferritin: 323 ng/mL — ABNORMAL HIGH (ref 11–307)

## 2019-04-26 LAB — PROCALCITONIN: Procalcitonin: 0.28 ng/mL

## 2019-04-26 LAB — FIBRINOGEN: Fibrinogen: 615 mg/dL — ABNORMAL HIGH (ref 210–475)

## 2019-04-26 LAB — C-REACTIVE PROTEIN: CRP: 13.4 mg/dL — ABNORMAL HIGH (ref <1.0)

## 2019-04-26 MED ORDER — APIXABAN 5 MG PO TABS
5.0000 mg | ORAL_TABLET | Freq: Two times a day (BID) | ORAL | Status: DC
  Administered 2019-04-27: 5 mg via ORAL
  Filled 2019-04-26 (×2): qty 1

## 2019-04-26 MED ORDER — NIFEDIPINE ER OSMOTIC RELEASE 30 MG PO TB24
30.0000 mg | ORAL_TABLET | Freq: Every day | ORAL | Status: DC
  Administered 2019-04-26: 30 mg via ORAL
  Filled 2019-04-26: qty 1

## 2019-04-26 MED ORDER — NIFEDIPINE ER OSMOTIC RELEASE 30 MG PO TB24: 60.0000 mg | ORAL_TABLET | Freq: Every day | ORAL | Status: DC

## 2019-04-26 MED ORDER — NIFEDIPINE ER OSMOTIC RELEASE 30 MG PO TB24
30.0000 mg | ORAL_TABLET | Freq: Once | ORAL | Status: AC
  Administered 2019-04-26: 30 mg via ORAL
  Filled 2019-04-26: qty 1

## 2019-04-26 NOTE — Progress Notes (Signed)
Patient ID: Ashley Rush, female   DOB: 11-Dec-1985, 33 y.o.   MRN: [id number] Pt feeling better throughout day.  O2 level stable on RA.  BP still above goal so will give another 30mg  po of procardia this PM and increase AM dose to 60mg .  Appreciate hospitalist input.  Though LFT's can rise with COVID, not usually with mild disease, so will recheck in AM.  Would like to see LFT's plateau before d/c.  If able to be discharged tomorrow, has remdesivir infusions set up for 12/23 and 12/24 to complete her 5 day course.  Will stay on steroids until 12/29  Pt prefers to switch eliquis from the lovenox for thrombotic prophylaxis for postpartum period as she does not plan to breastfeed.  D/w pharmacy and dosing would be  5mg  po BID.  Will discontinue lovenox and change to eliquis tomorrow AM

## 2019-04-26 NOTE — Op Note (Signed)
NAME: Ashley Rush, [place] MEDICAL RECORD [id number] ACCOUNT [id number] DATE OF BIRTH:25-Jul-1985 FACILITY: [facility] LOCATION: [facility] PHYSICIAN:Ermias Tomeo BOVARD-STUCKERT, MD  OPERATIVE REPORT  DATE OF PROCEDURE:  04/23/2019  PREOPERATIVE DIAGNOSES:  Intrauterine pregnancy at 37+ weeks, laboring with history of low transverse cesarean section, with rupture of membranes and declines trial of labor.  POSTOPERATIVE DIAGNOSES:  Intrauterine pregnancy at 37+ weeks, laboring with history of low transverse cesarean section, with rupture of membranes and declines trial of labor, delivered.  PROCEDURE:  Repeat low transverse cesarean section.  SURGEON:  Janyth Contes, MD  ASSISTANT:  Maida Sale, RNFA  ANESTHESIA:  Spinal.  ESTIMATED BLOOD LOSS:  Approximately 209 mL.  INTRAVENOUS FLUIDS AND URINE OUTPUT:  Per anesthesia records.  COMPLICATIONS:  Case was complicated by patient's fine and dense adhesions making access to the peritoneal cavity difficult and inability to exteriorize the uterus from said adhesions.  Patient is COVID positive.  PATHOLOGY:  Placenta to pathology.  DESCRIPTION OF PROCEDURE:  After informed consent was reviewed with the patient including risks, benefits and alternatives of the surgical procedure, she was transported to the OR 12 hours after her last dose of prophylactic Lovenox for history of DVT  and PE.  At this time, she had advanced cervical dilatation and still declined trial of labor.  Spinal anesthesia was placed and found to be adequate.  She was then placed in supine position with a leftward tilt. She was prepped and draped in the normal  sterile fashion.  Foley catheter was sterilely placed.  After an appropriate timeout, a Pfannenstiel skin incision was made at the level of her previous incision and carried through to the underlying layer of fascia sharply.  The fascia was then incised  in the midline and this was extended laterally with Mayo scissors.   Superior aspect of the fascial incision was grasped with Kocher clamps, elevated and the rectus muscles were dissected off both bluntly and sharply.  The midline was easily identified;  however, there was difficulty accessing the peritoneum due to multiple fine adhesions, making them somewhat dense.  The access to the peritoneal cavity was gained slowly with hemostats and the gentle incising of the peritoneal layers.  A window was  created.  This was extended superiorly and inferiorly with good visualization of the bladder.  This window was then stretched and the uterus was explored, noting adhesions both of the bladder and at the fundus as well.  However, the Alexis skin retractor  was able to be placed suboptimally.  The bladder flap was created both digitally and sharply.  The uterus was incised in a transverse fashion.  Infant was delivered from vertex presentation.  Nose and mouth were suctioned on the field.  After a minute,  the cord was clamped and cut and the infant was handed off to the waiting pediatric staff.  Uterus was cleared of all clot and debris.  Uterine incision was closed with 2 layers of 0 Monocryl, the first of which was running locked and the second as an  imbricating layer.  The gutters were cleared of all clot and debris and normal tubes and ovaries were seen; however, adhesions limited visualization.  There were also omental adhesions to the anterior abdominal wall.  These were ligated with Bovie  cautery.  The Alexis skin retractor was removed.  The peritoneum was reapproximated with 2-0 Vicryl in a running fashion.  The subfascial planes were inspected and found to be hemostatic.  The fascia was reapproximated with 0 Vicryl from  either corner  overlapping in the midline.  The subfascial planes were made hemostatic with Bovie cautery and the dead space was closed with plain gut.  The skin was closed with 4-0 Vicryl in a subcuticular fashion on a Keith needle.  Benzoin and  Steri-Strips were  applied.  The patient tolerated this procedure well.  Sponge, lap and needle counts were correct x2 at the end of the procedure per the operating room staff.  JN/NUANCE  D:04/25/2019 T:04/26/2019 JOB:[id number]

## 2019-04-26 NOTE — Progress Notes (Signed)
Patient scheduled for outpatient Remdesivir infusion at 230PM on Wednesday 12/23 and Thursday 12/24.  Please advise them to report to [facility] at [address].  Drive to the security guard and tell them you are here for an infusion. They will direct you to the front entrance where we will come and get you.  For questions call [phone].  Thanks     

## 2019-04-26 NOTE — Progress Notes (Signed)
Subjective: Postpartum Day 3: Cesarean Delivery/COVID associated pneumonia/possible endometritis?PIH s/p magnesium x 24 hours/ h/o DVT on full prophylaxis  Patient reports feeling better today, with improved dyspnea on exertion.  Ambulating to bathroom but still feels needs help because can feel a bit weak. Normal lochia.  Tolerating regular diet.  No HA or PIH sx specifically.  Bottlefeeding Pt states her grandmother is currently in-house here on 5th floor x 1 week and cousin at [facility] for last several days  Objective: Vital signs in last 24 hours: Temp:  [97.7 F (36.5 C)-100.4 F (38 C)] 98 F (36.7 C) (12/21 0750) Pulse Rate:  [82-122] 83 (12/21 0750) Resp:  [17-21] 18 (12/21 0750) BP: (117-144)/(85-93) 133/90 (12/21 0750) SpO2:  [95 %-97 %] 96 % (12/21 0750)  Physical Exam:  General: alert and cooperative Lochia: appropriate Uterine Fundus: firm, slightly tender Incision: C/D/I DVT Evaluation: mild bilateral edema, no s/s DVT  Recent Labs    04/25/19 1253 04/26/19 0703  HGB 12.8 12.2  HCT 38.3 36.3   Labs LFT's trended up slightly 120/72 from 67/41 Creatinine improved 0.88 form 1.05 Plts stable CRP increased to 13.4 from 9.9 Procalcitonin decreased slightly to .28 from .32  Assessment/Plan: Status post Cesarean section with COVID associated pneumonia, possible endometritis.   On Unasyn, remdesivir. and steroids with subjective improvement in breathing, pulse ox stable on RA (96%) Baby in nursery and isolating, pt does not feel well enough to care for baby in room yet so will leave baby in nursery for now Lovenox for h/o DVT will continue outpatient x 6 weeks Labs with increased LFT's but more likely due to COVID than preeclampsia since BP stable and  since creatinine and UOP are improved. Will continue to follow and keep PIH in differential  BP trending slightly up 130-140/85-93--will initiate procardia XL 30mg  for goal BP of 130/70  Pt advised family members in  home should be tested and quarantine.      Logan Bores 04/26/2019, 10:07 AM

## 2019-04-26 NOTE — Progress Notes (Signed)
Hospitalist daily note  Ashley Rush [id number] DOB: 1986/01/12 DOA: 04/23/2019  PCP: Antony Blackbird, MD   Narrative:  47 Blk Fem HTN?  [place] ->DVT/PE [id number] recent coronavirus 19 infection diagnosed 2013 and tested + 12/13/202 status post LTC S 04/23/2019-fever and mild shortness of breath 12/20 Comanagement requested regarding Covid  Data Reviewed:  LDH 238-->268 Ferritin 197-->323 CRP 9.9--13.4 Procalcitonin 0.28-->0.28 AST/ALT--?67/41---> 120/72 Bun/creat is currently 10/1.05 CT angiogram chest 12/20--Pulmonary moderate respiratory artifact scattered bilateral patchy groundglass opacities compatible infection pneumonia trace effusions  Assessment & Plan: Status post LTCS?  Chorioamnionitis Planning and management as per OB/GYN-placed on Unasyn continue the same HTN in pregnancy r/o PIH Defer to OB-GYN  ALT below 220 is compatible with remdisivir use ?  Pulmonary embolism--has had DVT ~ 6 yr ago per her--never Rx? Cta non-confirmatory--suggest Rpt Ct scan as per attending service--Would consider this scan in 2-3 weeks and decide on prolonged AC vs not Suggest transition to what OB is comfortable with for OP management PE Coronavirus 19 infection No oxygen req, labs indicate stability Continue Remdisivir ending 12/24 Steroids decadron through 12/29 From my perspective if continues to look this well could even possibly be d/c home and compelte Remdisivir at infusion center if desired?  Expect can d/c home 12/22 if primary service feels possible I will Alert [facility] to complete as an out patient infusions Remdisivir for 12/24 and 12/24 lovenox full dosing  Subjective: Awake coherent in good spirits excited to see baby soon No fever No SOB ambulatory Some Lochia rubra Mild abd tender No overt fever  Consultants:   Consulted by obstetrics gynecology Procedures:   12/18 Antimicrobials:   Perioperative cefazolin 12/18  Unasyn 12/20 remdesivir   Objective: Vitals:    04/25/19 1608 04/25/19 2007 04/25/19 2349 04/26/19 0342  BP: 117/85 (!) 134/93 (!) 144/91 (!) 133/93  Pulse: (!) 115 (!) 112 (!) 122 82  Resp: 20 20 20 17   Temp: 99.2 F (37.3 C) 98.9 F (37.2 C) 98.8 F (37.1 C) 97.7 F (36.5 C)  TempSrc: Oral Oral Oral Oral  SpO2: 96% 96% 97% 95%  Weight:      Height:        Intake/Output Summary (Last 24 hours) at 04/26/2019 0719 Last data filed at 04/26/2019 0343 Gross per 24 hour  Intake 820 ml  Output 3200 ml  Net -2380 ml   Filed Weights   04/23/19 0241  Weight: 93 kg    Examination: EOMI pleasant about stated age CTA b no added sound R>L Le swelling  Neuro no deficits  Scheduled Meds: . acetaminophen  1,000 mg Oral Q6H  . enoxaparin (LOVENOX) injection  1 mg/kg (Adjusted) Subcutaneous Q12H  . methylPREDNISolone (SOLU-MEDROL) injection  0.5 mg/kg Intravenous Q12H  . NIFEdipine  30 mg Oral Daily  . prenatal multivitamin  1 tablet Oral Q1200  . senna-docusate  2 tablet Oral Q24H  . simethicone  80 mg Oral TID PC  . simethicone  80 mg Oral Q24H  . Tdap  0.5 mL Intramuscular Once   Continuous Infusions: . ampicillin-sulbactam (UNASYN) IV 3 g (04/26/19 0500)  . naLOXone [facility]) adult infusion for PRURITIS    . remdesivir 100 mg in NS 100 mL       LOS: 3 days   Time spent: [place], MD [facility]

## 2019-04-27 LAB — COMPREHENSIVE METABOLIC PANEL
ALT: 66 U/L — ABNORMAL HIGH (ref 0–44)
AST: 85 U/L — ABNORMAL HIGH (ref 15–41)
Albumin: 2.4 g/dL — ABNORMAL LOW (ref 3.5–5.0)
Alkaline Phosphatase: 101 U/L (ref 38–126)
Anion gap: 11 (ref 5–15)
BUN: 10 mg/dL (ref 6–20)
CO2: 24 mmol/L (ref 22–32)
Calcium: 8.8 mg/dL — ABNORMAL LOW (ref 8.9–10.3)
Chloride: 104 mmol/L (ref 98–111)
Creatinine, Ser: 0.98 mg/dL (ref 0.44–1.00)
GFR calc Af Amer: >60 mL/min (ref >60)
GFR calc non Af Amer: >60 mL/min (ref >60)
Glucose, Bld: 108 mg/dL — ABNORMAL HIGH (ref 70–99)
Potassium: 4.5 mmol/L (ref 3.5–5.1)
Sodium: 139 mmol/L (ref 135–145)
Total Bilirubin: 0.6 mg/dL (ref 0.3–1.2)
Total Protein: 6.1 g/dL — ABNORMAL LOW (ref 6.5–8.1)

## 2019-04-27 LAB — SURGICAL PATHOLOGY

## 2019-04-27 MED ORDER — DEXAMETHASONE 6 MG PO TABS: 6.0000 mg | ORAL_TABLET | Freq: Every day | ORAL | 0 refills | Status: AC

## 2019-04-27 MED ORDER — OXYCODONE HCL 5 MG PO TABS: 5.0000 mg | ORAL_TABLET | ORAL | 0 refills | Status: DC | PRN

## 2019-04-27 MED ORDER — APIXABAN 5 MG PO TABS: 5.0000 mg | ORAL_TABLET | Freq: Two times a day (BID) | ORAL | 1 refills | Status: DC

## 2019-04-27 MED ORDER — NIFEDIPINE ER 60 MG PO TB24: 60.0000 mg | ORAL_TABLET | Freq: Every day | ORAL | 1 refills | Status: DC

## 2019-04-27 MED ORDER — NIFEDIPINE ER OSMOTIC RELEASE 30 MG PO TB24
60.0000 mg | ORAL_TABLET | Freq: Every day | ORAL | Status: DC
  Administered 2019-04-27: 60 mg via ORAL
  Filled 2019-04-27: qty 2

## 2019-04-27 MED ORDER — BENZONATATE 100 MG PO CAPS: 100.0000 mg | ORAL_CAPSULE | Freq: Three times a day (TID) | ORAL | 0 refills | Status: AC

## 2019-04-27 MED FILL — DEXAMETHASONE 2 MG TABLET: 2 | 7 days supply | Qty: 21 | Fill #0

## 2019-04-27 MED FILL — NIFEdipine ER 60 MG TB24: 60 | 30 days supply | Qty: 30 | Fill #0

## 2019-04-27 MED FILL — ELIQUIS 5 MG TABLET: 5 | 30 days supply | Qty: 60 | Fill #0

## 2019-04-27 MED FILL — oxyCODONE HCL 5 MG TABS: 5 | 5 days supply | Qty: 30 | Fill #0

## 2019-04-27 MED FILL — BENZONATATE 100 MG CAPS: 100 | 5 days supply | Qty: 15 | Fill #0

## 2019-04-27 NOTE — Final Consult Note (Signed)
Patient seen examined doing well no oxygen requirement -needs Decadron till 12/29--added to [facility] for dc  -patient aware to follow up as OP at [facility] for 2 more infusions 12/23, 12/24 of remdisivir  No charge  Verneita Griffes, MD [facility] 10:57 AM

## 2019-04-27 NOTE — Progress Notes (Signed)
CSW received consult due to score 11 on Edinburgh Depression Screen. CSW spoke with MOB via telephone to offer support and complete assessment.    MOB very pleasant over the phone and engaged throughout assessment. CSW introduced self and explained reason for consult to which MOB expressed understanding. CSW inquired about Edinburgh Score and MOB shared she has been down the last few days due to COVID diagnosis and having to have a c-section. MOB reported she feels things have improved greatly and is feeling good now. MOB denied any previous mental health history of PMADs with previous pregnancy. CSW provided education regarding Baby Blues vs PMADs.  CSW encouraged MOB to evaluate her mental health throughout the postpartum period and notify a medical professional if symptoms arise. MOB denied any current SI, HI or DV and reported feeling well-supported by her whole family. CSW addressed MOB's score of 1 for "hardly ever" on question 10 regarding thoughts of self-harm. MOB acknowledged this but denied any recent thoughts or feelings and again denied any SI. MOB confirmed having all essential items for infant once discharged and reported infant would be sleeping in a crib once home. CSW provided review of Sudden Infant Death Syndrome (SIDS) precautions and safe sleeping habits.    CSW identifies no further need for intervention and no barriers to discharge at this time.  Makynleigh Breslin, LCSW [facility] [phone]  

## 2019-04-27 NOTE — Discharge Summary (Signed)
OB Discharge Summary     Patient Name: Ashley Rush DOB: 10-Jul-1985 MRN: [id number]  Date of admission: 04/23/2019 Delivering MD: Janyth Contes   Date of discharge: 04/27/2019  Admitting diagnosis: Maternal care due to low transverse uterine scar from previous cesarean delivery [O34.211] Status post repeat low transverse cesarean section [Z98.891] Intrauterine pregnancy: [redacted]w[redacted]d     Secondary diagnosis:  Active Problems:   Status post repeat low transverse cesarean section   Acute respiratory disease due to COVID-19 virus  Additional problems: History of DVT     Discharge diagnosis: Term Pregnancy Delivered and Gestational Hypertension                                                                                                Post partum procedures:none  Augmentation: none  Complications: Management of respiratory condition due to [facility] course:  Onset of Labor With planned C/S  33 y.o. yo [id number] at [redacted]w[redacted]d was admitted in [place] on 04/23/2019. Patient had a labor course significant for + diagnosis of covid, PIH, history of DVT. Membrane Rupture Time/Date: 7:15 AM ,04/23/2019   The patient went for cesarean section due to Elective Repeat, and delivered a Viable infant,04/23/2019  Details of operation can be found in separate operative note. Patient had an uncomplicated postpartum course.  She is ambulating,tolerating a regular diet, passing flatus, and urinating well.  Patient is discharged home in stable condition 04/27/19.  Physical exam  Vitals:   04/26/19 1748 04/26/19 2004 04/27/19 0214 04/27/19 0601  BP: (!) 138/99 (!) 133/91 130/86 125/81  Pulse: 97 96 98 94  Resp:  20 18 18   Temp:  98 F (36.7 C) 97.8 F (36.6 C) 98 F (36.7 C)  TempSrc:  Oral Oral Oral  SpO2: 94%  99% 99%  Weight:      Height:       General: alert, cooperative and no distress Lochia: appropriate Uterine Fundus: firm Incision: Healing well with no significant  drainage DVT Evaluation: RLE edematous and enlarged calf - stable for patient Labs: Lab Results  Component Value Date   WBC 6.0 04/26/2019   HGB 12.2 04/26/2019   HCT 36.3 04/26/2019   MCV 90.1 04/26/2019   PLT 179 04/26/2019   CMP Latest Ref Rng & Units 04/27/2019  Glucose 70 - 99 mg/dL 108(H)  BUN 6 - 20 mg/dL 10  Creatinine 0.44 - 1.00 mg/dL 0.98  Sodium 135 - 145 mmol/L 139  Potassium 3.5 - 5.1 mmol/L 4.5  Chloride 98 - 111 mmol/L 104  CO2 22 - 32 mmol/L 24  Calcium 8.9 - 10.3 mg/dL 8.8(L)  Total Protein 6.5 - 8.1 g/dL 6.1(L)  Total Bilirubin 0.3 - 1.2 mg/dL 0.6  Alkaline Phos 38 - 126 U/L 101  AST 15 - 41 U/L 85(H)  ALT 0 - 44 U/L 66(H)    Discharge instruction: per After Visit Summary and "Baby and Me Booklet".  After visit meds:  Allergies as of 04/27/2019      Reactions   Morphine And Related Itching   Shellfish Allergy Swelling  Medication List    STOP taking these medications   enoxaparin 40 MG/0.4ML injection Commonly known as: LOVENOX     TAKE these medications   acetaminophen 500 MG tablet Commonly known as: TYLENOL Take 500 mg by mouth every 6 (six) hours as needed.   apixaban 5 MG Tabs tablet Commonly known as: ELIQUIS Take 1 tablet (5 mg total) by mouth 2 (two) times daily.   benzonatate 100 MG capsule Commonly known as: TESSALON Take 1 capsule (100 mg total) by mouth 3 (three) times daily for 5 days.   glucose blood test strip FreeStyle Lite Strips   metformin 1000 MG (OSM) 24 hr tablet Commonly known as: FORTAMET Take 1,000 mg by mouth 2 (two) times daily with a meal.   NIFEdipine 60 MG 24 hr tablet Commonly known as: ADALAT CC Take 1 tablet (60 mg total) by mouth daily. Start taking on: April 28, 2019   oxyCODONE 5 MG immediate release tablet Commonly known as: Oxy IR/ROXICODONE Take 1 tablet (5 mg total) by mouth every 4 (four) hours as needed for moderate pain or severe pain.   Prenatal Vitamin 27-0.8 MG Tabs Take 1  tablet by mouth daily.   ROBITUSSIN DM INFANT DROPS PO Take 10 mLs by mouth every 6 (six) hours as needed (cough).       Diet: low salt diet  Activity: Advance as tolerated. Pelvic rest for 6 weeks.   Outpatient follow up:1 week for BP check, 2 weeks for incision check and 6 weeks for postpartum visit Follow up Appt: Future Appointments  Date Time Provider Department Center  04/28/2019  2:30 PM [facility] None  04/29/2019  2:30 PM [facility] None   Follow up Visit:No follow-ups on file.  Postpartum contraception: Not Discussed  Newborn Data: Live born female  Birth Weight: 6 lb 2.6 oz (2795 g) APGAR: 7, 9  Newborn Delivery   Birth date/time: 04/23/2019 09:24:00 Delivery type: C-Section, Low Transverse Trial of labor: No C-section categorization: Repeat      Baby Feeding: Bottle Disposition:home with mother   04/27/2019 Cathrine Muster, DO

## 2019-04-27 NOTE — Progress Notes (Addendum)
Subjective: Postpartum Day 3: Cesarean Delivery Patient reports incisional pain, tolerating PO, + flatus and no problems voiding - except slight sting.  Rep[orts pain medication helps with abdominal and incisional pain. Lochia mild. Denies fever or chills. Occasional HA. Baby in room with her. Appear to be bonding well  Objective: Vital signs in last 24 hours: Temp:  [97.7 F (36.5 C)-98 F (36.7 C)] 98 F (36.7 C) (12/22 0601) Pulse Rate:  [94-102] 94 (12/22 0601) Resp:  [18-20] 18 (12/22 0601) BP: (125-142)/(81-99) 125/81 (12/22 0601) SpO2:  [94 %-99 %] 99 % (12/22 0601)  Physical Exam:  General: alert, cooperative and no distress Lochia: appropriate Uterine Fundus: Moderate distension, soft, FF, some guarding Incision: healing well, no significant drainage on dressing DVT Evaluation: RLE almost 2x size of left; no edema left LE. Pt reports no change in LE from history - not concerned  Recent Labs    04/25/19 1253 04/26/19 0703  HGB 12.8 12.2  HCT 38.3 36.3    Assessment/Plan: Status post Cesarean section. Doing well postoperatively.  Discharge home with standard precautions and return to clinic in 1 week for BP check, 2 weeks for incision check and 6 weeks for postpartum visit Pt to continue on eliquis 5mg  po bid Pt set up for remdesivir infusions outpt - 12/23 and 12/24 Continue on steriods till 12/29? - Will clarify with hospitalist Continue procardia 60xl daily Pt requests to continue on tessalon as well  Isaiah Serge 04/27/2019, 10:31 AM

## 2019-04-27 NOTE — Discharge Instructions (Signed)
You are scheduled for an outpatient infusion of Remdesivir at 230PM on Wednesday 12/23 and Thursday 12/24.  Please report to [facility] at [address]  Drive to the security guard and tell them you are here for an infusion. They will direct you to the front entrance where we will come and get you.  For questions call [phone].  Thanks   Call OBGYN office with any concerns [phone]

## 2019-04-28 ENCOUNTER — Ambulatory Visit (HOSPITAL_COMMUNITY)
Admission: RE | Admit: 2019-04-28 | Discharge: 2019-04-28 | Disposition: A | Discharge status: Home / Self Care | Payer: 59 | Source: Ambulatory Visit | Attending: Pulmonary Disease | Admitting: Pulmonary Disease

## 2019-04-28 DIAGNOSIS — O98513 Other viral diseases complicating pregnancy, third trimester: Secondary | ICD-10-CM | POA: Diagnosis not present

## 2019-04-28 DIAGNOSIS — U071 COVID-19: Secondary | ICD-10-CM | POA: Insufficient documentation

## 2019-04-28 MED ORDER — METHYLPREDNISOLONE SODIUM SUCC 125 MG IJ SOLR: 125.0000 mg | Freq: Once | INTRAMUSCULAR | Status: DC | PRN

## 2019-04-28 MED ORDER — SODIUM CHLORIDE 0.9 % IV SOLN: 100.0000 mg | Freq: Once | INTRAVENOUS | Status: DC

## 2019-04-28 MED ORDER — DIPHENHYDRAMINE HCL 50 MG/ML IJ SOLN: 50.0000 mg | Freq: Once | INTRAMUSCULAR | Status: DC | PRN

## 2019-04-28 MED ORDER — ALBUTEROL SULFATE HFA 108 (90 BASE) MCG/ACT IN AERS: 2.0000 | INHALATION_SPRAY | Freq: Once | RESPIRATORY_TRACT | Status: DC | PRN

## 2019-04-28 MED ORDER — SODIUM CHLORIDE 0.9 % IV SOLN
INTRAVENOUS | Status: AC
  Filled 2019-04-28: qty 20

## 2019-04-28 MED ORDER — SODIUM CHLORIDE 0.9 % IV SOLN
100.0000 mg | Freq: Once | INTRAVENOUS | Status: AC
  Administered 2019-04-28: 15:00:00 100 mg via INTRAVENOUS

## 2019-04-28 MED ORDER — EPINEPHRINE 0.3 MG/0.3ML IJ SOAJ: 0.3000 mg | Freq: Once | INTRAMUSCULAR | Status: DC | PRN

## 2019-04-28 MED ORDER — FAMOTIDINE IN NACL 20-0.9 MG/50ML-% IV SOLN: 20.0000 mg | Freq: Once | INTRAVENOUS | Status: DC | PRN

## 2019-04-28 MED ORDER — SODIUM CHLORIDE 0.9 % IV SOLN: INTRAVENOUS | Status: DC | PRN

## 2019-04-28 MED ORDER — SODIUM CHLORIDE 0.9 % IV SOLN
INTRAVENOUS | Status: DC | PRN
  Administered 2019-04-28: 15:00:00 250 mL via INTRAVENOUS

## 2019-04-28 NOTE — Progress Notes (Signed)
  Diagnosis: COVID-19  Physician: Dr. Antony Blackbird  Procedure: Covid Infusion Clinic Med: remdesivir infusion.  Complications: No immediate complications noted.  Discharge: Discharged home   Terryann Verbeek L 04/28/2019

## 2019-04-28 NOTE — Discharge Instructions (Addendum)
COVID-19: How to Protect Yourself and Others Know how it spreads  There is currently no vaccine to prevent coronavirus disease 2019 (COVID-19).  The best way to prevent illness is to avoid being exposed to this virus.  The virus is thought to spread mainly from person-to-person. ? Between people who are in close contact with one another (within about 6 feet). ? Through respiratory droplets produced when an infected person coughs, sneezes or talks. ? These droplets can land in the mouths or noses of people who are nearby or possibly be inhaled into the lungs. ? Some recent studies have suggested that COVID-19 may be spread by people who are not showing symptoms. Everyone should Clean your hands often  Wash your hands often with soap and water for at least 20 seconds especially after you have been in a public place, or after blowing your nose, coughing, or sneezing.  If soap and water are not readily available, use a hand sanitizer that contains at least 60% alcohol. Cover all surfaces of your hands and rub them together until they feel dry.  Avoid touching your eyes, nose, and mouth with unwashed hands. Avoid close contact  Stay home if you are sick.  Avoid close contact with people who are sick.  Put distance between yourself and other people. ? Remember that some people without symptoms may be able to spread virus. ? This is especially important for people who are at higher risk of getting very sick.www.cdc.gov/coronavirus/2019-ncov/need-extra-precautions/people-at-higher-risk.html Cover your mouth and nose with a cloth face cover when around others  You could spread COVID-19 to others even if you do not feel sick.  Everyone should wear a cloth face cover when they have to go out in public, for example to the grocery store or to pick up other necessities. ? Cloth face coverings should not be placed on young children under age 2, anyone who has trouble breathing, or is unconscious,  incapacitated or otherwise unable to remove the mask without assistance.  The cloth face cover is meant to protect other people in case you are infected.  Do NOT use a facemask meant for a healthcare worker.  Continue to keep about 6 feet between yourself and others. The cloth face cover is not a substitute for social distancing. Cover coughs and sneezes  If you are in a private setting and do not have on your cloth face covering, remember to always cover your mouth and nose with a tissue when you cough or sneeze or use the inside of your elbow.  Throw used tissues in the trash.  Immediately wash your hands with soap and water for at least 20 seconds. If soap and water are not readily available, clean your hands with a hand sanitizer that contains at least 60% alcohol. Clean and disinfect  Clean AND disinfect frequently touched surfaces daily. This includes tables, doorknobs, light switches, countertops, handles, desks, phones, keyboards, toilets, faucets, and sinks. www.cdc.gov/coronavirus/2019-ncov/prevent-getting-sick/disinfecting-your-home.html  If surfaces are dirty, clean them: Use detergent or soap and water prior to disinfection.  Then, use a household disinfectant. You can see a list of EPA-registered household disinfectants here. cdc.gov/coronavirus 09/08/2018 This information is not intended to replace advice given to you by your health care provider. Make sure you discuss any questions you have with your health care provider. Document Released: 08/18/2018 Document Revised: 09/16/2018 Document Reviewed: 08/18/2018 Elsevier Patient Education  2020 Elsevier Inc.  

## 2019-04-29 ENCOUNTER — Other Ambulatory Visit (HOSPITAL_COMMUNITY): Payer: Self-pay

## 2019-04-29 ENCOUNTER — Ambulatory Visit (HOSPITAL_COMMUNITY)
Admit: 2019-04-29 | Discharge: 2019-04-29 | Disposition: A | Discharge status: Home / Self Care | Payer: 59 | Attending: Pulmonary Disease | Admitting: Pulmonary Disease

## 2019-04-29 DIAGNOSIS — U071 COVID-19: Secondary | ICD-10-CM | POA: Diagnosis not present

## 2019-04-29 DIAGNOSIS — O98513 Other viral diseases complicating pregnancy, third trimester: Secondary | ICD-10-CM | POA: Diagnosis not present

## 2019-04-29 MED ORDER — METHYLPREDNISOLONE SODIUM SUCC 125 MG IJ SOLR: 125.0000 mg | Freq: Once | INTRAMUSCULAR | Status: DC | PRN

## 2019-04-29 MED ORDER — ALBUTEROL SULFATE HFA 108 (90 BASE) MCG/ACT IN AERS: 2.0000 | INHALATION_SPRAY | Freq: Once | RESPIRATORY_TRACT | Status: DC | PRN

## 2019-04-29 MED ORDER — FAMOTIDINE IN NACL 20-0.9 MG/50ML-% IV SOLN: 20.0000 mg | Freq: Once | INTRAVENOUS | Status: DC | PRN

## 2019-04-29 MED ORDER — SODIUM CHLORIDE 0.9 % IV SOLN
INTRAVENOUS | Status: AC
  Filled 2019-04-29: qty 20

## 2019-04-29 MED ORDER — SODIUM CHLORIDE 0.9 % IV SOLN
INTRAVENOUS | Status: DC | PRN
  Administered 2019-04-29: 15:00:00 250 mL via INTRAVENOUS

## 2019-04-29 MED ORDER — DIPHENHYDRAMINE HCL 50 MG/ML IJ SOLN: 50.0000 mg | Freq: Once | INTRAMUSCULAR | Status: DC | PRN

## 2019-04-29 MED ORDER — SODIUM CHLORIDE 0.9 % IV SOLN
100.0000 mg | Freq: Once | INTRAVENOUS | Status: AC
  Administered 2019-04-29: 100 mg via INTRAVENOUS

## 2019-04-29 MED ORDER — EPINEPHRINE 0.3 MG/0.3ML IJ SOAJ: 0.3000 mg | Freq: Once | INTRAMUSCULAR | Status: DC | PRN

## 2019-04-29 NOTE — Discharge Instructions (Signed)
COVID-19: How to Protect Yourself and Others Know how it spreads  There is currently no vaccine to prevent coronavirus disease 2019 (COVID-19).  The best way to prevent illness is to avoid being exposed to this virus.  The virus is thought to spread mainly from person-to-person. ? Between people who are in close contact with one another (within about 6 feet). ? Through respiratory droplets produced when an infected person coughs, sneezes or talks. ? These droplets can land in the mouths or noses of people who are nearby or possibly be inhaled into the lungs. ? Some recent studies have suggested that COVID-19 may be spread by people who are not showing symptoms. Everyone should Clean your hands often  Wash your hands often with soap and water for at least 20 seconds especially after you have been in a public place, or after blowing your nose, coughing, or sneezing.  If soap and water are not readily available, use a hand sanitizer that contains at least 60% alcohol. Cover all surfaces of your hands and rub them together until they feel dry.  Avoid touching your eyes, nose, and mouth with unwashed hands. Avoid close contact  Stay home if you are sick.  Avoid close contact with people who are sick.  Put distance between yourself and other people. ? Remember that some people without symptoms may be able to spread virus. ? This is especially important for people who are at higher risk of getting very sick.www.cdc.gov/coronavirus/2019-ncov/need-extra-precautions/people-at-higher-risk.html Cover your mouth and nose with a cloth face cover when around others  You could spread COVID-19 to others even if you do not feel sick.  Everyone should wear a cloth face cover when they have to go out in public, for example to the grocery store or to pick up other necessities. ? Cloth face coverings should not be placed on young children under age 2, anyone who has trouble breathing, or is unconscious,  incapacitated or otherwise unable to remove the mask without assistance.  The cloth face cover is meant to protect other people in case you are infected.  Do NOT use a facemask meant for a healthcare worker.  Continue to keep about 6 feet between yourself and others. The cloth face cover is not a substitute for social distancing. Cover coughs and sneezes  If you are in a private setting and do not have on your cloth face covering, remember to always cover your mouth and nose with a tissue when you cough or sneeze or use the inside of your elbow.  Throw used tissues in the trash.  Immediately wash your hands with soap and water for at least 20 seconds. If soap and water are not readily available, clean your hands with a hand sanitizer that contains at least 60% alcohol. Clean and disinfect  Clean AND disinfect frequently touched surfaces daily. This includes tables, doorknobs, light switches, countertops, handles, desks, phones, keyboards, toilets, faucets, and sinks. www.cdc.gov/coronavirus/2019-ncov/prevent-getting-sick/disinfecting-your-home.html  If surfaces are dirty, clean them: Use detergent or soap and water prior to disinfection.  Then, use a household disinfectant. You can see a list of EPA-registered household disinfectants here. cdc.gov/coronavirus 09/08/2018 This information is not intended to replace advice given to you by your health care provider. Make sure you discuss any questions you have with your health care provider. Document Released: 08/18/2018 Document Revised: 09/16/2018 Document Reviewed: 08/18/2018 Elsevier Patient Education  2020 Elsevier Inc.  

## 2019-04-29 NOTE — Progress Notes (Signed)
  Diagnosis: COVID-19  Physician: Dr. Wright  Procedure: Covid Infusion Clinic Med: remdesivir infusion.  Complications: No immediate complications noted.  Discharge: Discharged home   Quatavious Rossa 04/29/2019   

## 2019-04-29 NOTE — Progress Notes (Signed)
  Diagnosis: COVID-19  Physician: Antony Blackbird, MD  Procedure: Covid Infusion Clinic Med: remdesivir infusion.  Complications: No immediate complications noted.  Discharge: Discharged home   [place] 04/29/2019

## 2019-05-03 ENCOUNTER — Ambulatory Visit (HOSPITAL_COMMUNITY)
Admission: RE | Admit: 2019-05-03 | Discharge: 2019-05-03 | Disposition: A | Discharge status: Home / Self Care | Payer: 59 | Source: Ambulatory Visit | Attending: Obstetrics and Gynecology | Admitting: Obstetrics and Gynecology

## 2019-05-03 ENCOUNTER — Other Ambulatory Visit: Payer: Self-pay

## 2019-05-03 ENCOUNTER — Other Ambulatory Visit (HOSPITAL_COMMUNITY): Payer: Self-pay | Admitting: Obstetrics and Gynecology

## 2019-05-03 DIAGNOSIS — M79605 Pain in left leg: Secondary | ICD-10-CM | POA: Insufficient documentation

## 2019-05-03 DIAGNOSIS — O139 Gestational [pregnancy-induced] hypertension without significant proteinuria, unspecified trimester: Secondary | ICD-10-CM | POA: Diagnosis not present

## 2019-05-03 DIAGNOSIS — M79604 Pain in right leg: Secondary | ICD-10-CM | POA: Diagnosis not present

## 2019-05-03 DIAGNOSIS — R309 Painful micturition, unspecified: Secondary | ICD-10-CM | POA: Diagnosis not present

## 2019-05-03 DIAGNOSIS — M7989 Other specified soft tissue disorders: Secondary | ICD-10-CM | POA: Diagnosis present

## 2019-05-03 MED FILL — BENZONATATE 100 MG CAPS: 100 | 7 days supply | Qty: 20 | Fill #0

## 2019-05-03 MED FILL — SULFAMETHOXAZOLE-TMP DS TAB: 800-160 | 3 days supply | Qty: 6 | Fill #0

## 2019-05-03 MED FILL — HYDROCODON-APAP 5-325: 5-325 | 3 days supply | Qty: 10 | Fill #0

## 2019-05-03 NOTE — Progress Notes (Signed)
VASCULAR LAB PRELIMINARY  PRELIMINARY  PRELIMINARY  PRELIMINARY  Right lower extremity venous duplex completed.    Preliminary report:  See CV proc for preliminary results.   Called Janyth Contes, MD with results.   Andrea Colglazier, RVT 05/03/2019, 3:29 PM

## 2019-05-04 ENCOUNTER — Other Ambulatory Visit (HOSPITAL_COMMUNITY): Admission: RE | Admit: 2019-05-04 | Payer: 59 | Source: Ambulatory Visit

## 2019-05-06 ENCOUNTER — Inpatient Hospital Stay (HOSPITAL_COMMUNITY): Admit: 2019-05-06 | Payer: 59 | Admitting: Obstetrics and Gynecology

## 2019-05-06 MED FILL — FLUCONAZOLE 150 MG TABLET: 150 | 5 days supply | Qty: 2 | Fill #0

## 2019-05-06 MED FILL — SULFAMETHOXAZOLE-TMP DS TAB: 800-160 | 3 days supply | Qty: 6 | Fill #0

## 2019-05-07 ENCOUNTER — Other Ambulatory Visit: Payer: Self-pay

## 2019-05-07 ENCOUNTER — Inpatient Hospital Stay (HOSPITAL_COMMUNITY)
Admission: AD | Admit: 2019-05-07 | Discharge: 2019-05-07 | Disposition: A | Discharge status: Home / Self Care | Payer: 59 | Attending: Obstetrics and Gynecology | Admitting: Obstetrics and Gynecology

## 2019-05-07 ENCOUNTER — Encounter (HOSPITAL_COMMUNITY): Payer: Self-pay | Admitting: Obstetrics and Gynecology

## 2019-05-07 DIAGNOSIS — Z833 Family history of diabetes mellitus: Secondary | ICD-10-CM | POA: Diagnosis not present

## 2019-05-07 DIAGNOSIS — O163 Unspecified maternal hypertension, third trimester: Secondary | ICD-10-CM | POA: Insufficient documentation

## 2019-05-07 DIAGNOSIS — Z8632 Personal history of gestational diabetes: Secondary | ICD-10-CM | POA: Diagnosis not present

## 2019-05-07 DIAGNOSIS — Z4801 Encounter for change or removal of surgical wound dressing: Secondary | ICD-10-CM | POA: Insufficient documentation

## 2019-05-07 DIAGNOSIS — Z8249 Family history of ischemic heart disease and other diseases of the circulatory system: Secondary | ICD-10-CM | POA: Insufficient documentation

## 2019-05-07 DIAGNOSIS — Z4889 Encounter for other specified surgical aftercare: Secondary | ICD-10-CM

## 2019-05-07 DIAGNOSIS — Z86718 Personal history of other venous thrombosis and embolism: Secondary | ICD-10-CM | POA: Diagnosis not present

## 2019-05-07 LAB — URINALYSIS, ROUTINE W REFLEX MICROSCOPIC
Bacteria, UA: NONE SEEN
Bilirubin Urine: NEGATIVE
Glucose, UA: NEGATIVE mg/dL
Ketones, ur: NEGATIVE mg/dL
Leukocytes,Ua: NEGATIVE
Nitrite: POSITIVE — AB
Protein, ur: NEGATIVE mg/dL
Specific Gravity, Urine: 1.004 — ABNORMAL LOW (ref 1.005–1.030)
pH: 6 (ref 5.0–8.0)

## 2019-05-07 NOTE — [facility] Provider Note (Signed)
Pt known from office.  Has hematoma at [place] incision, evacuated some clot and old blood yesterday in office.  Fascia explored, intact.  Requested f/u at [facility] for re-eval  Pt has been using hot compresses, draining old blood and small clots  Pain improved from yesterday in office, not as swollen   [id number] LTCS x 2, TAB x 2, SAB No abn pap, no STD  G5 recent rLTCS - hematoma at incision and PIH  PMH Covid w pneumonia, DVT, GDM, HTN PSH LTCS x 2, D&C  FH DM, HTN  All NKDA SH denies tobacco, ETOH or drugs, married, works in [employer] Procardia, Ibuprofen, Hydrocodone, PNV, eliquis  ROS: neg F/C/N/V/D/C, some dysuria (improving w abx)  AFVSS BP 140/80 - pt with h/o PIH, Covid w delivery rLTCS - pna PP On Procardia XL 60  gen NAD Abd soft, NT, ND, + [employer] small drainage holes 1cm, 2 x <1cm, good granulation tissue, otherwise well-healed  unable to open incision and evacuate entire hematoma.\ Encourage hot compresses and pressure to compress incision Fascia explored and intact  Pt has f/u appt Monday at office  Reviewed normal PIH labs x PCR (contaminated w blood, also UTI) treating UTI with Bactrim DS  D/C with pain, bleeding precautions.  F/u Monday as scheduled

## 2019-05-07 NOTE — Progress Notes (Signed)
Dr. Ellyn Hack into see pt & eval surgical incision.

## 2019-05-07 NOTE — [facility] Note (Signed)
.   Ashley Rush is a 34 y.o. at [redacted]w[redacted]d here in [facility] reporting: that her c/s incision has a couple of places bleeding   LMP:  Onset of complaint: Saturday Pain score: 6 Vitals:   05/07/19 0920  BP: (!) 142/85  Pulse: (!) 102  Resp: 16  Temp: 98.6 F (37 C)     FHT: Lab orders placed from triage: UA

## 2019-05-19 MED FILL — BENZONATATE 100 MG CAPS: 100 | 7 days supply | Qty: 21 | Fill #0

## 2019-05-27 DIAGNOSIS — I10 Essential (primary) hypertension: Secondary | ICD-10-CM | POA: Diagnosis not present

## 2019-05-27 MED FILL — HYDROCHLOROTHIAZIDE 25 MG T: 25 | 32 days supply | Qty: 16 | Fill #0

## 2019-05-28 MED FILL — NIFEdipine ER 60 MG TB24: 60 | 30 days supply | Qty: 30 | Fill #1

## 2019-06-03 DIAGNOSIS — Z3009 Encounter for other general counseling and advice on contraception: Secondary | ICD-10-CM | POA: Diagnosis not present

## 2019-06-03 DIAGNOSIS — Z1389 Encounter for screening for other disorder: Secondary | ICD-10-CM | POA: Diagnosis not present

## 2019-06-03 MED FILL — NORLYDA 0.35 MG TABS: 0.35 | 84 days supply | Qty: 84 | Fill #0

## 2019-06-22 MED FILL — HYDROCHLOROTHIAZIDE 25 MG T: 25 | 30 days supply | Qty: 30 | Fill #0

## 2019-06-22 MED FILL — BLISOVI FE 1/20 1-20 MG-MCG: 1-20 | 28 days supply | Qty: 28 | Fill #0

## 2019-07-23 MED FILL — BLISOVI FE 1/20 1-20 MG-MCG: 1-20 | 28 days supply | Qty: 28 | Fill #1

## 2019-08-20 MED FILL — BLISOVI FE 1/20 1-20 MG-MCG: 1-20 | 56 days supply | Qty: 56 | Fill #2

## 2019-09-08 MED FILL — HYDROCHLOROTHIAZIDE 25 MG T: 25 | 30 days supply | Qty: 30 | Fill #0

## 2019-09-16 DIAGNOSIS — H5203 Hypermetropia, bilateral: Secondary | ICD-10-CM | POA: Diagnosis not present

## 2019-10-29 MED FILL — HYDROCHLOROTHIAZIDE 25 MG T: 25 | 30 days supply | Qty: 30 | Fill #0

## 2019-12-01 DIAGNOSIS — I10 Essential (primary) hypertension: Secondary | ICD-10-CM | POA: Diagnosis not present

## 2019-12-01 MED FILL — HYDROCHLOROTHIAZIDE 12.5 MG: 12.5 | 30 days supply | Qty: 30 | Fill #0

## 2019-12-01 MED FILL — NIFEdipine ER 60 MG TB24: 60 | 30 days supply | Qty: 30 | Fill #0

## 2019-12-10 MED FILL — HYDROCHLOROTHIAZIDE 12.5 MG: 12.5 | 30 days supply | Qty: 30 | Fill #0

## 2019-12-10 MED FILL — NIFEdipine ER 60 MG TB24: 60 | 30 days supply | Qty: 30 | Fill #0

## 2019-12-14 ENCOUNTER — Other Ambulatory Visit (HOSPITAL_COMMUNITY): Payer: Self-pay | Admitting: Obstetrics and Gynecology

## 2019-12-15 ENCOUNTER — Other Ambulatory Visit (HOSPITAL_COMMUNITY): Payer: Self-pay | Admitting: Obstetrics and Gynecology

## 2019-12-15 DIAGNOSIS — Z113 Encounter for screening for infections with a predominantly sexual mode of transmission: Secondary | ICD-10-CM | POA: Diagnosis not present

## 2019-12-15 DIAGNOSIS — Z86718 Personal history of other venous thrombosis and embolism: Secondary | ICD-10-CM | POA: Diagnosis not present

## 2019-12-15 DIAGNOSIS — D573 Sickle-cell trait: Secondary | ICD-10-CM | POA: Diagnosis not present

## 2019-12-15 DIAGNOSIS — Z3A29 29 weeks gestation of pregnancy: Secondary | ICD-10-CM | POA: Diagnosis not present

## 2019-12-15 DIAGNOSIS — Z6826 Body mass index (BMI) 26.0-26.9, adult: Secondary | ICD-10-CM | POA: Diagnosis not present

## 2019-12-15 DIAGNOSIS — Z1389 Encounter for screening for other disorder: Secondary | ICD-10-CM | POA: Diagnosis not present

## 2019-12-15 DIAGNOSIS — Z3041 Encounter for surveillance of contraceptive pills: Secondary | ICD-10-CM | POA: Diagnosis not present

## 2019-12-15 DIAGNOSIS — Z130 Encounter for screening for diseases of the blood and blood-forming organs and certain disorders involving the immune mechanism: Secondary | ICD-10-CM | POA: Diagnosis not present

## 2019-12-15 DIAGNOSIS — Z01419 Encounter for gynecological examination (general) (routine) without abnormal findings: Secondary | ICD-10-CM | POA: Diagnosis not present

## 2019-12-15 MED FILL — BLISOVI FE 1/20 1-20 MG-MCG: 1-20 | 84 days supply | Qty: 84 | Fill #0

## 2020-03-20 MED FILL — BLISOVI FE 1/20 1-20 MG-MCG: 1-20 | 28 days supply | Qty: 28 | Fill #0

## 2020-04-19 MED FILL — BLISOVI FE 1/20 1-20 MG-MCG: 1-20 | 84 days supply | Qty: 84 | Fill #1

## 2020-05-13 ENCOUNTER — Other Ambulatory Visit: Payer: Self-pay

## 2020-05-13 ENCOUNTER — Ambulatory Visit (INDEPENDENT_AMBULATORY_CARE_PROVIDER_SITE_OTHER): Payer: 59

## 2020-05-13 ENCOUNTER — Ambulatory Visit (HOSPITAL_COMMUNITY)
Admission: EM | Admit: 2020-05-13 | Discharge: 2020-05-13 | Disposition: A | Discharge status: Home / Self Care | Payer: 59 | Attending: Physician Assistant | Admitting: Physician Assistant

## 2020-05-13 ENCOUNTER — Encounter (HOSPITAL_COMMUNITY): Payer: Self-pay | Admitting: Emergency Medicine

## 2020-05-13 DIAGNOSIS — S92505A Nondisplaced unspecified fracture of left lesser toe(s), initial encounter for closed fracture: Secondary | ICD-10-CM

## 2020-05-13 DIAGNOSIS — M79675 Pain in left toe(s): Secondary | ICD-10-CM | POA: Diagnosis not present

## 2020-05-13 DIAGNOSIS — S92535A Nondisplaced fracture of distal phalanx of left lesser toe(s), initial encounter for closed fracture: Secondary | ICD-10-CM | POA: Diagnosis not present

## 2020-05-13 NOTE — ED Provider Notes (Signed)
[facility]    CSN: [id number] Arrival date & time: 05/13/20  1017      History   Chief Complaint Chief Complaint  Patient presents with  . Toe Injury    HPI Ashley Rush is a 35 y.o. female.   The history is provided by the patient. No language interpreter was used.  Foot Injury Location:  Toe Injury: yes   Toe location:  L fourth toe Pain details:    Quality:  Aching   Severity:  No pain   Timing:  Constant Chronicity:  New Relieved by:  Nothing Worsened by:  Nothing Ineffective treatments:  None tried   Past Medical History:  Diagnosis Date  . Coronavirus infection   . DVT (deep venous thrombosis) (HCC)   . Gestational diabetes   . Hypertension   . Miscarriage     Patient Active Problem List   Diagnosis Date Noted  . COVID-19 affecting pregnancy in third trimester 04/25/2019  . Maternal care due to low transverse uterine scar from previous cesarean delivery 04/23/2019  . Status post repeat low transverse cesarean section 04/23/2019  . Abnormal human chorionic gonadotropin (hCG) 05/13/2017  . Pregnancy of unknown anatomic location 05/13/2017  . Edema 04/23/2013  . Lymphedema 04/23/2013  . Spotting between menses 12/21/2010  . Acne 09/07/2010    Past Surgical History:  Procedure Laterality Date  . CESAREAN SECTION  08/24/07  . CESAREAN SECTION N/A 04/23/2019   Procedure: CESAREAN SECTION;  Surgeon: Sherian Rein, MD;  Location: [facility];  Service: Obstetrics;  Laterality: N/A;    OB History    Gravida  5   Para  2   Term  2   Preterm      AB  3   Living  2     SAB  1   IAB  2   Ectopic      Multiple  0   Live Births  2            Home Medications    Prior to Admission medications   Medication Sig Start Date End Date Taking? Authorizing Provider  acetaminophen (TYLENOL) 500 MG tablet Take 500 mg by mouth every 6 (six) hours as needed.    [provider]  apixaban (ELIQUIS) 5 MG TABS  tablet Take 1 tablet (5 mg total) by mouth 2 (two) times daily. 04/27/19   Edwinna Areola, DO  NIFEdipine (ADALAT CC) 60 MG 24 hr tablet Take 1 tablet (60 mg total) by mouth daily. 04/28/19   Banga, Sharol Given, DO  oxyCODONE (OXY IR/ROXICODONE) 5 MG immediate release tablet Take 1 tablet (5 mg total) by mouth every 4 (four) hours as needed for moderate pain or severe pain. 04/27/19   Banga, Sharol Given, DO  Prenatal Vit-Fe Fumarate-FA (PRENATAL VITAMIN) 27-0.8 MG TABS Take 1 tablet by mouth daily.    [provider]    Family History Family History  Problem Relation Age of Onset  . Diabetes Mother   . Heart disease Mother   . Immunodeficiency Mother        HIV- deceased at age 28  . Hypertension Mother   . Diabetes Father   . Heart disease Father   . Immunodeficiency Father        HIV- died last year at 71  . Hypertension Father   . Varicose Veins Father   . Heart attack Father     Social History Social History   Tobacco Use  .  Smoking status: Never Smoker  . Smokeless tobacco: Never Used  Vaping Use  . Vaping Use: Never used  Substance Use Topics  . Alcohol use: Yes    Alcohol/week: 3.0 standard drinks    Types: 3 Shots of liquor per week  . Drug use: No     Allergies   Morphine and related and Shellfish allergy   Review of Systems Review of Systems  All other systems reviewed and are negative.    Physical Exam Triage Vital Signs ED Triage Vitals  Enc Vitals Group     BP 05/13/20 1032 (!) 164/92     Pulse Rate 05/13/20 1032 78     Resp 05/13/20 1032 17     Temp 05/13/20 1032 99.1 F (37.3 C)     Temp Source 05/13/20 1032 Oral     SpO2 05/13/20 1032 97 %     Weight --      Height --      Head Circumference --      Peak Flow --      Pain Score 05/13/20 1029 0     Pain Loc --      Pain Edu? --      Excl. in GC? --    No data found.  Updated Vital Signs BP (!) 164/92 (BP Location: Left Arm)   Pulse 78   Temp 99.1 F (37.3  C) (Oral)   Resp 17   LMP 04/22/2020 (Approximate)   SpO2 97%   Breastfeeding No   Visual Acuity Right Eye Distance:   Left Eye Distance:   Bilateral Distance:    Right Eye Near:   Left Eye Near:    Bilateral Near:     Physical Exam Vitals and nursing note reviewed.  Constitutional:      Appearance: She is well-developed and well-nourished.  HENT:     Head: Normocephalic.  Eyes:     Extraocular Movements: EOM normal.  Abdominal:     General: There is no distension.  Musculoskeletal:        General: Swelling and tenderness present. Normal range of motion.     Cervical back: Normal range of motion.     Comments: Swollen left 4th toe, nv and ns intact  Skin:    General: Skin is warm.  Neurological:     Mental Status: She is alert and oriented to person, place, and time.  Psychiatric:        Mood and Affect: Mood and affect and mood normal.      UC Treatments / Results  Labs (all labs ordered are listed, but only abnormal results are displayed) Labs Reviewed - No data to display  EKG   Radiology DG Toe 4th Left  Result Date: 05/13/2020 CLINICAL DATA:  Fourth toe pain and bruising after injury yesterday. EXAM: LEFT FOURTH TOE COMPARISON:  None. FINDINGS: Congenitally short third digit. Non segmented middle and distal phalanges of the fourth digit with superimposed nondisplaced fracture best seen on the lateral view. IMPRESSION: Nondisplaced fracture of the fourth distal phalanx. Electronically Signed   By: Marnee Spring M.D.   On: 05/13/2020 11:05    Procedures Procedures (including critical care time)  Medications Ordered in UC Medications - No data to display  Initial Impression / Assessment and Plan / UC Course  I have reviewed the triage vital signs and the nursing notes.  Pertinent labs & imaging results that were available during my care of the patient were reviewed by me and considered  in my medical decision making (see chart for details).      MDM:  Xray shows fracture of 4th metatarsal.  Buddy tape.  Final Clinical Impressions(s) / UC Diagnoses   Final diagnoses:  Closed nondisplaced fracture of phalanx of lesser toe of left foot, unspecified phalanx, initial encounter     Discharge Instructions     Return if any problems.     ED Prescriptions    None     PDMP not reviewed this encounter.  An After Visit Summary was printed and given to the patient.    Elson Areas, [place] 05/13/20 1127

## 2020-05-13 NOTE — Discharge Instructions (Signed)
Return if any problems.

## 2020-05-13 NOTE — ED Triage Notes (Signed)
Pt states that she rolled over her fourth toe on the left foot yesterday at work. Pt state that there is swelling and bruising. Pt states that she is not in pain right now only when she walks.

## 2020-06-28 IMAGING — CT CT ANGIO CHEST
2 of 6 series · 18 of 46 positions shown · IV contrast (omnipaque)
Comparison: None.

CLINICAL DATA: 33-year-old female with acute shortness of breath.
COVID positive.

EXAM:
CT ANGIOGRAPHY CHEST WITH CONTRAST
TECHNIQUE: Multidetector CT imaging of the chest was performed using the
standard protocol during bolus administration of intravenous
contrast. Multiplanar CT image reconstructions and MIPs were
obtained to evaluate the vascular anatomy.
CONTRAST:  65mL OMNIPAQUE IOHEXOL 350 MG/ML SOLN

[Series 6: thins · axial · 0.69mm/px · z∈[+1232,+1516]mm · 15 of 311 slices shown]
[im 14/311  lung]
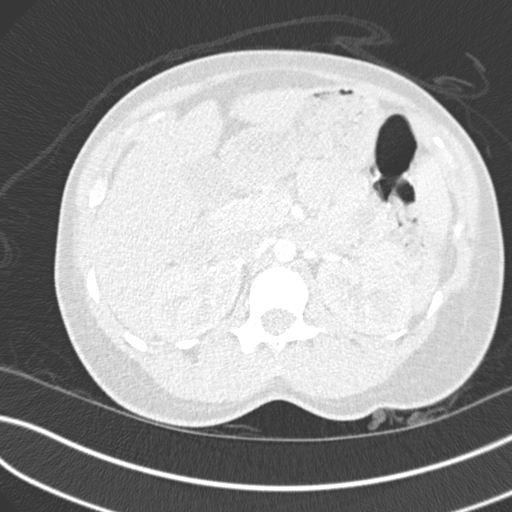
[im 41/311  soft-tissue]
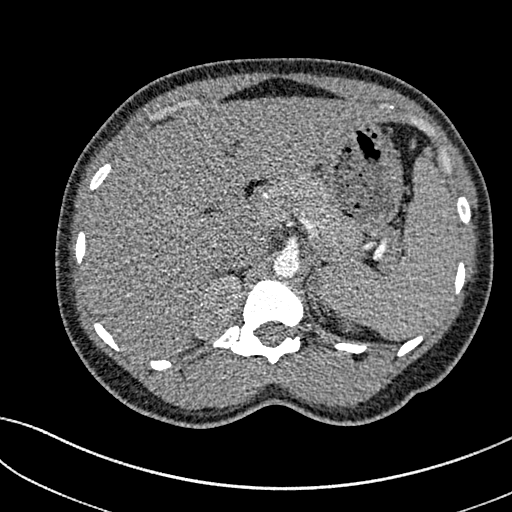
[im 54/311  lung]
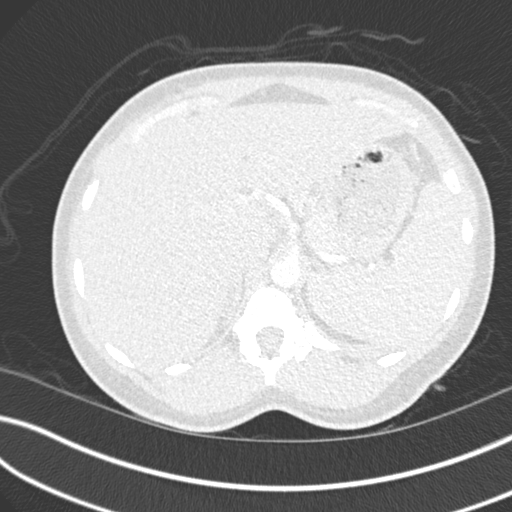
[im 81/311  soft-tissue]
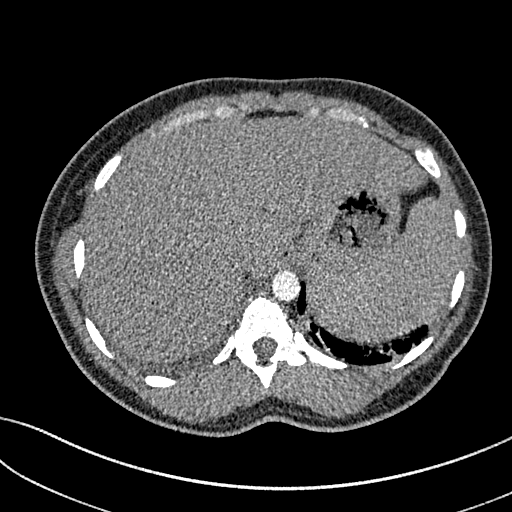
[im 95/311  lung]
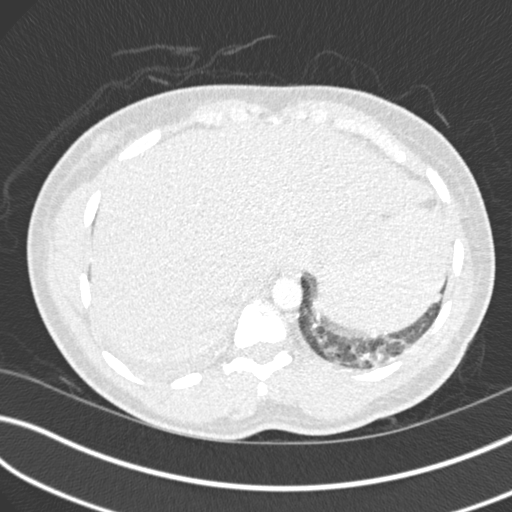
[im 122/311  soft-tissue]
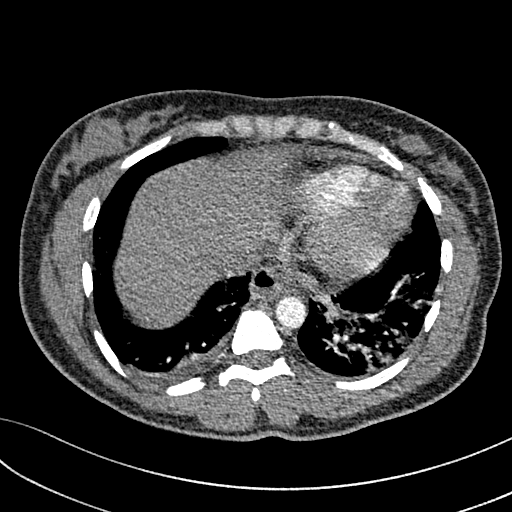
[im 135/311  lung]
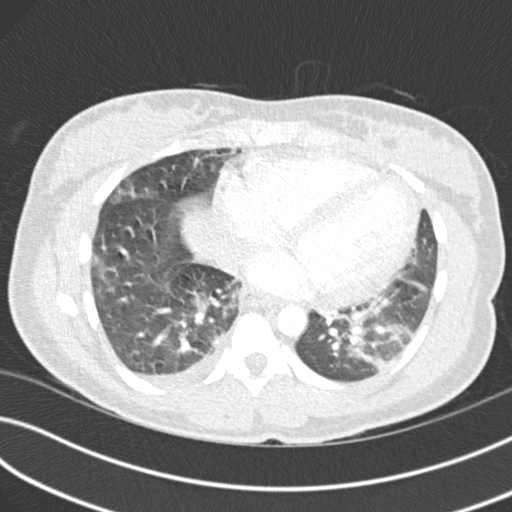
[im 162/311  soft-tissue]
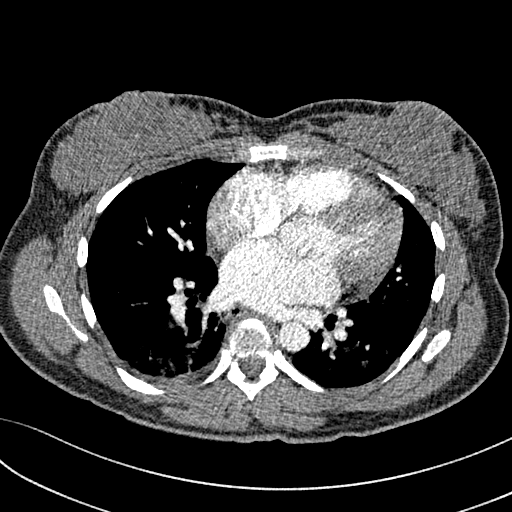
[im 176/311  lung]
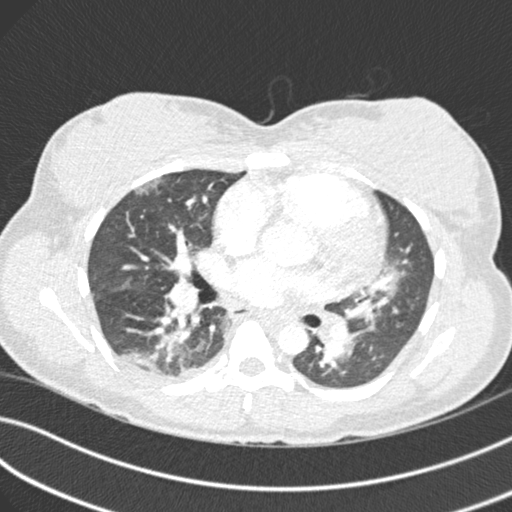
[im 189/311  soft-tissue]
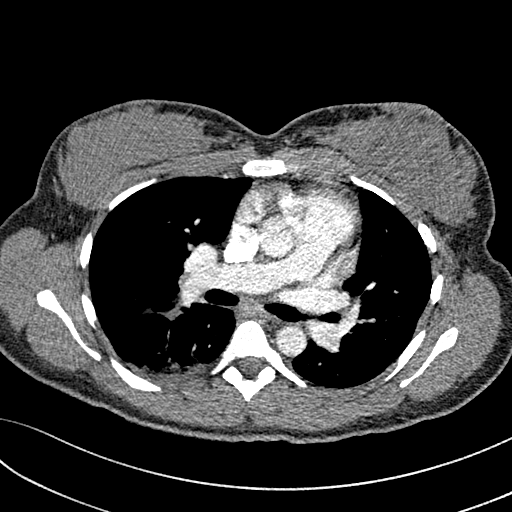
[im 216/311  lung]
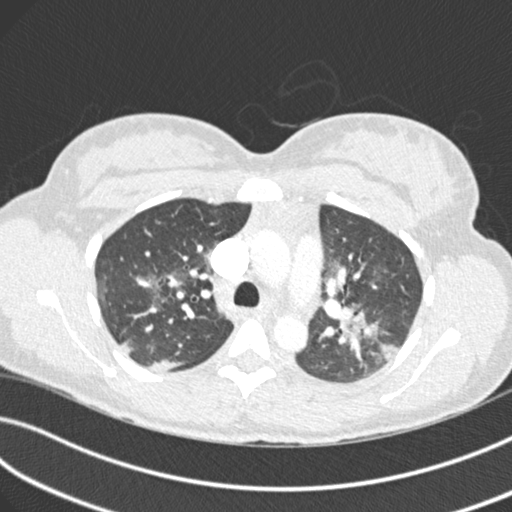
[im 230/311  soft-tissue]
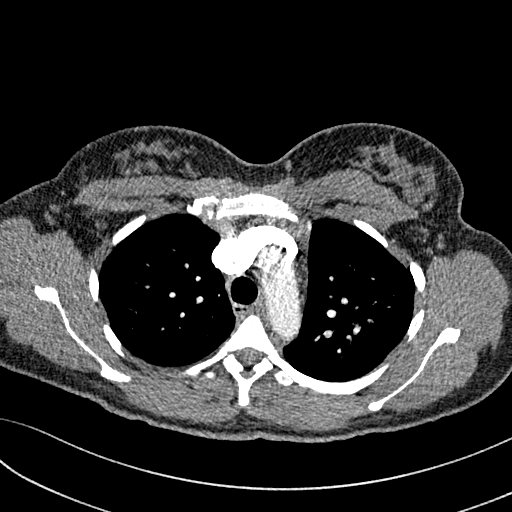
[im 257/311  lung]
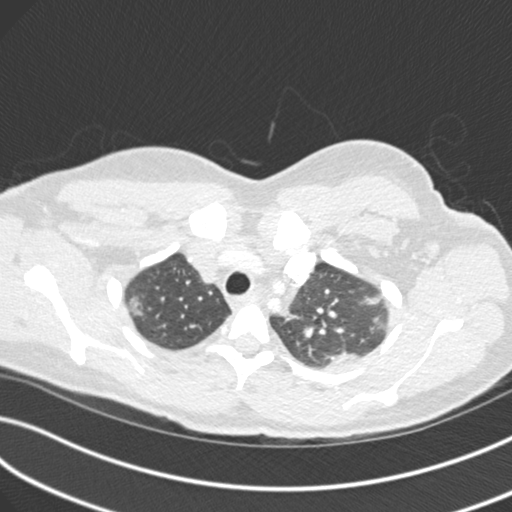
[im 270/311  soft-tissue]
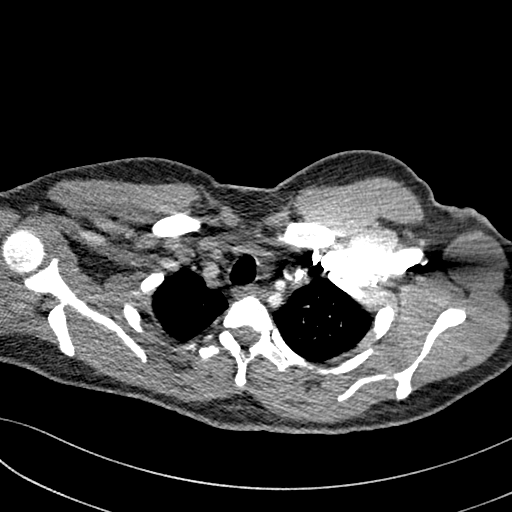
[im 297/311  lung]
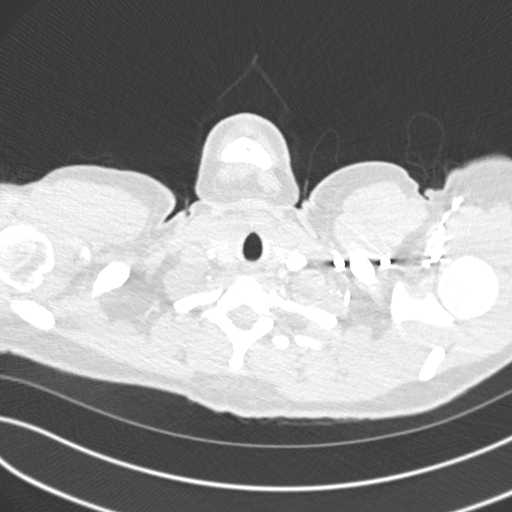

[Series 8: coronal mpr · coronal · 0.61mm/px · 3 of 151 slices shown]
[im 38/151  soft-tissue]
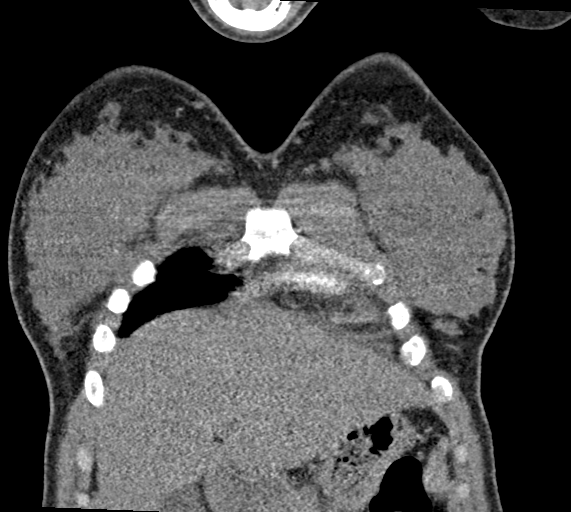
[im 76/151  soft-tissue]
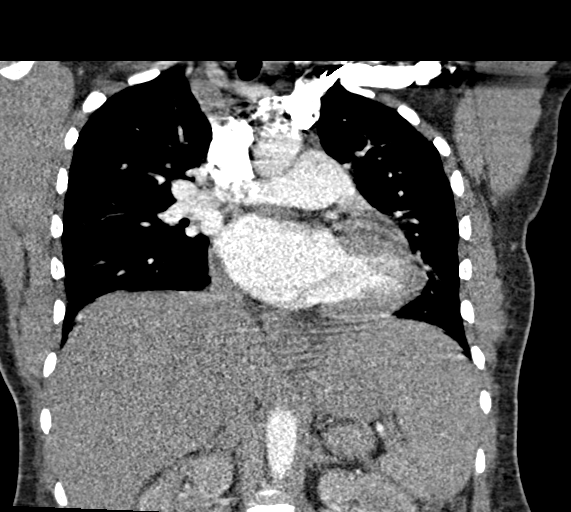
[im 113/151  soft-tissue]
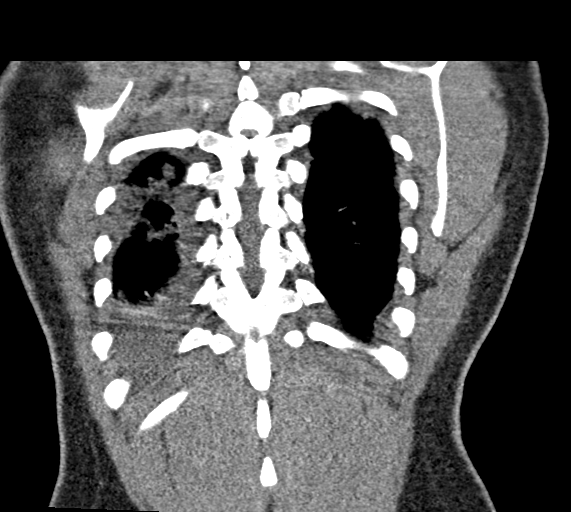

[18 of 46 positions shown; findings below may reference images not displayed]

FINDINGS: Cardiovascular: This is a technically borderline study given
moderate respiratory motion artifact. No large/central pulmonary
emboli are identified.

UPPER limits normal heart size identified.

No pericardial effusion or thoracic aortic aneurysm.

Mediastinum/Nodes: No enlarged mediastinal, hilar, or axillary lymph
nodes. Thyroid gland, trachea, and esophagus demonstrate no
significant findings.

Lungs/Pleura: Scattered bilateral patchy ground-glass and airspace
opacities are noted compatible with infection. A trace RIGHT pleural
effusion and mild bibasilar atelectasis identified.

No pneumothorax identified.

Upper Abdomen: Unremarkable

Musculoskeletal: No acute or suspicious bony abnormality identified.

Review of the MIP images confirms the above findings.
IMPRESSION: 1. Technically borderline study for pulmonary emboli given moderate
respiratory motion artifact. No large/central pulmonary emboli
identified.
2. Scattered bilateral patchy ground-glass and airspace opacities
compatible with infection/pneumonia.
3. Trace RIGHT pleural effusion and mild bibasilar atelectasis.

## 2020-07-30 ENCOUNTER — Other Ambulatory Visit: Payer: Self-pay

## 2020-07-30 ENCOUNTER — Encounter (HOSPITAL_COMMUNITY): Payer: Self-pay | Admitting: Emergency Medicine

## 2020-07-30 ENCOUNTER — Ambulatory Visit (HOSPITAL_COMMUNITY)
Admission: EM | Admit: 2020-07-30 | Discharge: 2020-07-30 | Disposition: A | Discharge status: Home / Self Care | Payer: 59 | Attending: Student | Admitting: Student

## 2020-07-30 DIAGNOSIS — N3001 Acute cystitis with hematuria: Secondary | ICD-10-CM | POA: Diagnosis not present

## 2020-07-30 DIAGNOSIS — R030 Elevated blood-pressure reading, without diagnosis of hypertension: Secondary | ICD-10-CM | POA: Diagnosis not present

## 2020-07-30 DIAGNOSIS — B3731 Acute candidiasis of vulva and vagina: Secondary | ICD-10-CM

## 2020-07-30 DIAGNOSIS — B373 Candidiasis of vulva and vagina: Secondary | ICD-10-CM

## 2020-07-30 LAB — POC URINE PREG, ED: Preg Test, Ur: NEGATIVE

## 2020-07-30 LAB — POCT URINALYSIS DIPSTICK, ED / UC
Bilirubin Urine: NEGATIVE
Glucose, UA: NEGATIVE mg/dL
Ketones, ur: NEGATIVE mg/dL
Nitrite: NEGATIVE
Protein, ur: NEGATIVE mg/dL
Specific Gravity, Urine: 1.02 (ref 1.005–1.030)
Urobilinogen, UA: 0.2 mg/dL (ref 0.0–1.0)
pH: 6 (ref 5.0–8.0)

## 2020-07-30 MED ORDER — CEPHALEXIN 500 MG PO CAPS: 500.0000 mg | ORAL_CAPSULE | Freq: Four times a day (QID) | ORAL | 0 refills | Status: DC

## 2020-07-30 MED ORDER — FLUCONAZOLE 150 MG PO TABS: 150.0000 mg | ORAL_TABLET | Freq: Every day | ORAL | 0 refills | Status: DC

## 2020-07-30 NOTE — ED Provider Notes (Signed)
[facility]    CSN: [id number] Arrival date & time: 07/30/20  1307      History   Chief Complaint Chief Complaint  Patient presents with  . Dysuria    HPI Ashley Rush is a 35 y.o. female presenting for urinary symptoms.  History DVT, hypertension.  Endorses 2 days of dysuria and frequency.  Denies hematuria, urgency, back pain, n/v/d/abd pain, fevers/chills, abdnormal vaginal discharge. No prior history of UTIs. Denies STI risk. For elevated BP- Denies dizziness, chest pain, shortness of breath, weakness or sensation changes in 1 arm or 1 leg.  States that she was previously on medication for hypertension but has been off this for years.  HPI  Past Medical History:  Diagnosis Date  . Coronavirus infection   . DVT (deep venous thrombosis) (HCC)   . Gestational diabetes   . Hypertension   . Miscarriage     Patient Active Problem List   Diagnosis Date Noted  . COVID-19 affecting pregnancy in third trimester 04/25/2019  . Maternal care due to low transverse uterine scar from previous cesarean delivery 04/23/2019  . Status post repeat low transverse cesarean section 04/23/2019  . Abnormal human chorionic gonadotropin (hCG) 05/13/2017  . Pregnancy of unknown anatomic location 05/13/2017  . Edema 04/23/2013  . Lymphedema 04/23/2013  . Spotting between menses 12/21/2010  . Acne 09/07/2010    Past Surgical History:  Procedure Laterality Date  . CESAREAN SECTION  08/24/07  . CESAREAN SECTION N/A 04/23/2019   Procedure: CESAREAN SECTION;  Surgeon: Sherian Rein, MD;  Location: [facility];  Service: Obstetrics;  Laterality: N/A;    OB History    Gravida  5   Para  2   Term  2   Preterm      AB  3   Living  2     SAB  1   IAB  2   Ectopic      Multiple  0   Live Births  2            Home Medications    Prior to Admission medications   Medication Sig Start Date End Date Taking? Authorizing Provider  cephALEXin (KEFLEX) 500  MG capsule Take 1 capsule (500 mg total) by mouth 4 (four) times daily. 07/30/20  Yes Rhys Martini, PA-C  fluconazole (DIFLUCAN) 150 MG tablet Take 1 tablet (150 mg total) by mouth daily. -Take one pill at onset of symptoms. Take second pill on day 3 if symptoms persist. 07/30/20  Yes Rhys Martini, PA-C  acetaminophen (TYLENOL) 500 MG tablet Take 500 mg by mouth every 6 (six) hours as needed.    [provider]  apixaban (ELIQUIS) 5 MG TABS tablet Take 1 tablet (5 mg total) by mouth 2 (two) times daily. 04/27/19   Edwinna Areola, DO  NIFEdipine (ADALAT CC) 60 MG 24 hr tablet Take 1 tablet (60 mg total) by mouth daily. 04/28/19   Banga, Sharol Given, DO  oxyCODONE (OXY IR/ROXICODONE) 5 MG immediate release tablet Take 1 tablet (5 mg total) by mouth every 4 (four) hours as needed for moderate pain or severe pain. 04/27/19   Banga, Sharol Given, DO  Prenatal Vit-Fe Fumarate-FA (PRENATAL VITAMIN) 27-0.8 MG TABS Take 1 tablet by mouth daily.    [provider]    Family History Family History  Problem Relation Age of Onset  . Diabetes Mother   . Heart disease Mother   . Immunodeficiency Mother  HIV- deceased at age 85  . Hypertension Mother   . Diabetes Father   . Heart disease Father   . Immunodeficiency Father        HIV- died last year at 65  . Hypertension Father   . Varicose Veins Father   . Heart attack Father     Social History Social History   Tobacco Use  . Smoking status: Never Smoker  . Smokeless tobacco: Never Used  Vaping Use  . Vaping Use: Never used  Substance Use Topics  . Alcohol use: Yes    Alcohol/week: 3.0 standard drinks    Types: 3 Shots of liquor per week  . Drug use: No     Allergies   Morphine and related and Shellfish allergy   Review of Systems Review of Systems  Constitutional: Negative for appetite change, chills, diaphoresis and fever.  Respiratory: Negative for shortness of breath.   Cardiovascular:  Negative for chest pain.  Gastrointestinal: Negative for abdominal pain, blood in stool, constipation, diarrhea, nausea and vomiting.  Genitourinary: Positive for dysuria and frequency. Negative for decreased urine volume, difficulty urinating, flank pain, genital sores, hematuria and urgency.  Musculoskeletal: Negative for back pain.  Neurological: Negative for dizziness, weakness and light-headedness.  All other systems reviewed and are negative.    Physical Exam Triage Vital Signs ED Triage Vitals  Enc Vitals Group     BP      Pulse      Resp      Temp      Temp src      SpO2      Weight      Height      Head Circumference      Peak Flow      Pain Score      Pain Loc      Pain Edu?      Excl. in GC?    No data found.  Updated Vital Signs BP (!) 153/107 (BP Location: Left Arm)   Pulse 74   Temp (!) 97.1 F (36.2 C) (Oral)   Resp 18   LMP 07/23/2020   SpO2 95%   Visual Acuity Right Eye Distance:   Left Eye Distance:   Bilateral Distance:    Right Eye Near:   Left Eye Near:    Bilateral Near:     Physical Exam Vitals reviewed.  Constitutional:      General: She is not in acute distress.    Appearance: Normal appearance. She is not ill-appearing.  HENT:     Head: Normocephalic and atraumatic.     Mouth/Throat:     Mouth: Mucous membranes are moist.     Comments: Moist mucous membranes Eyes:     Extraocular Movements: Extraocular movements intact.     Pupils: Pupils are equal, round, and reactive to light.  Cardiovascular:     Rate and Rhythm: Normal rate and regular rhythm.     Heart sounds: Normal heart sounds.  Pulmonary:     Effort: Pulmonary effort is normal.     Breath sounds: Normal breath sounds. No wheezing, rhonchi or rales.  Abdominal:     General: Bowel sounds are normal. There is no distension.     Palpations: Abdomen is soft. There is no mass.     Tenderness: There is abdominal tenderness in the suprapubic area. There is no right CVA  tenderness, left CVA tenderness, guarding or rebound.  Skin:    General: Skin is warm.  Capillary Refill: Capillary refill takes less than 2 seconds.     Comments: Good skin turgor  Neurological:     General: No focal deficit present.     Mental Status: She is alert and oriented to person, place, and time.  Psychiatric:        Mood and Affect: Mood normal.        Behavior: Behavior normal.        Thought Content: Thought content normal.        Judgment: Judgment normal.      UC Treatments / Results  Labs (all labs ordered are listed, but only abnormal results are displayed) Labs Reviewed  POCT URINALYSIS DIPSTICK, ED / UC - Abnormal; Notable for the following components:      Result Value   Hgb urine dipstick SMALL (*)    Leukocytes,Ua TRACE (*)    All other components within normal limits  URINE CULTURE  POC URINE PREG, ED    EKG   Radiology No results found.  Procedures Procedures (including critical care time)  Medications Ordered in UC Medications - No data to display  Initial Impression / Assessment and Plan / UC Course  I have reviewed the triage vital signs and the nursing notes.  Pertinent labs & imaging results that were available during my care of the patient were reviewed by me and considered in my medical decision making (see chart for details).     This patient is a 35 year old female presenting with acute cystitis with hematuria. Today this pt is afebrile nontachycardic nontachypneic, oxygenating well on room air, no wheezes rhonchi or rales.  No CVAT, low concern for Pyelo.   UA with small blood, trace leuk. Culture sent. Urine pregnancy negative.   Plan to treat with Keflex as below. rec good hydration.   Diflucan for yeast prophylaxis. No vaginal symptoms today.  BP initially 169/118. 153/107 on recheck. rec monitoring this at home, f/u with PCP if continued elevated readings.  Return precautions discussed.  This chart was dictated  using voice recognition software, Dragon. Despite the best efforts of this provider to proofread and correct errors, errors may still occur which can change documentation meaning.  Final Clinical Impressions(s) / UC Diagnoses   Final diagnoses:  Acute cystitis with hematuria  Elevated blood pressure reading  Vaginal candida     Discharge Instructions     -For urinary tract infection, start the antibiotic-Keflex (cephalexin), 5 pills daily for 4 days. -I also sent a prescription for Diflucan (fluconazole).  This will prevent or treat a yeast infection.  You can start this at the same time as you start the antibiotic, or wait until you develop vaginal symptoms. -Please check your blood pressure at home or at the pharmacy. If this continues to be >140/90, follow-up with your primary care provider for further blood pressure management/ medication titration. If you develop chest pain, shortness of breath, vision changes, the worst headache of your life- head straight to the ED or call 911.     ED Prescriptions    Medication Sig Dispense Auth. Provider   cephALEXin (KEFLEX) 500 MG capsule Take 1 capsule (500 mg total) by mouth 4 (four) times daily. 20 capsule Rhys Martini, PA-C   fluconazole (DIFLUCAN) 150 MG tablet Take 1 tablet (150 mg total) by mouth daily. -Take one pill at onset of symptoms. Take second pill on day 3 if symptoms persist. 2 tablet Rhys Martini, PA-C     PDMP not reviewed this  encounter.   Rhys MartiniGraham, Claudy Abdallah E, PA-C 07/30/20 1445

## 2020-07-30 NOTE — ED Triage Notes (Signed)
Pt presents today with c/o of burning with urination x 2 days. Denies n/v/d or abd pain.

## 2020-07-30 NOTE — Discharge Instructions (Signed)
-  For urinary tract infection, start the antibiotic-Keflex (cephalexin), 5 pills daily for 4 days. -I also sent a prescription for Diflucan (fluconazole).  This will prevent or treat a yeast infection.  You can start this at the same time as you start the antibiotic, or wait until you develop vaginal symptoms. -Please check your blood pressure at home or at the pharmacy. If this continues to be >140/90, follow-up with your primary care provider for further blood pressure management/ medication titration. If you develop chest pain, shortness of breath, vision changes, the worst headache of your life- head straight to the ED or call 911.

## 2020-07-31 LAB — URINE CULTURE

## 2020-08-09 ENCOUNTER — Other Ambulatory Visit (HOSPITAL_COMMUNITY): Payer: Self-pay

## 2020-08-09 MED FILL — Norethindrone Ace & Ethinyl Estradiol-FE Tab 1 MG-20 MCG: ORAL | 84 days supply | Qty: 84 | Fill #0 | Status: AC

## 2020-08-11 ENCOUNTER — Other Ambulatory Visit (HOSPITAL_COMMUNITY): Payer: Self-pay

## 2020-08-14 ENCOUNTER — Other Ambulatory Visit (HOSPITAL_COMMUNITY): Payer: Self-pay

## 2020-08-15 ENCOUNTER — Other Ambulatory Visit (HOSPITAL_COMMUNITY): Payer: Self-pay

## 2020-08-15 DIAGNOSIS — N898 Other specified noninflammatory disorders of vagina: Secondary | ICD-10-CM | POA: Diagnosis not present

## 2020-08-15 DIAGNOSIS — A5900 Urogenital trichomoniasis, unspecified: Secondary | ICD-10-CM | POA: Diagnosis not present

## 2020-08-15 DIAGNOSIS — R309 Painful micturition, unspecified: Secondary | ICD-10-CM | POA: Diagnosis not present

## 2020-08-15 DIAGNOSIS — N760 Acute vaginitis: Secondary | ICD-10-CM | POA: Diagnosis not present

## 2020-08-15 MED ORDER — METRONIDAZOLE 500 MG PO TABS
500.0000 mg | ORAL_TABLET | Freq: Two times a day (BID) | ORAL | 0 refills | Status: AC
  Filled 2020-08-15: qty 14, 7d supply, fill #0

## 2020-08-15 MED ORDER — SULFAMETHOXAZOLE-TRIMETHOPRIM 800-160 MG PO TABS
1.0000 | ORAL_TABLET | Freq: Two times a day (BID) | ORAL | 0 refills | Status: DC
  Filled 2020-08-15: qty 6, 3d supply, fill #0

## 2020-08-16 ENCOUNTER — Other Ambulatory Visit (HOSPITAL_COMMUNITY): Payer: Self-pay

## 2020-08-16 DIAGNOSIS — R309 Painful micturition, unspecified: Secondary | ICD-10-CM | POA: Diagnosis not present

## 2020-08-18 ENCOUNTER — Other Ambulatory Visit (HOSPITAL_COMMUNITY): Payer: Self-pay

## 2020-08-22 ENCOUNTER — Other Ambulatory Visit (HOSPITAL_COMMUNITY): Payer: Self-pay

## 2020-08-22 MED ORDER — FLUCONAZOLE 150 MG PO TABS
150.0000 mg | ORAL_TABLET | Freq: Every day | ORAL | 0 refills | Status: DC
  Filled 2020-08-22: qty 2, 5d supply, fill #0
  Filled 2020-09-05: qty 2, 2d supply, fill #0

## 2020-08-31 ENCOUNTER — Other Ambulatory Visit (HOSPITAL_COMMUNITY): Payer: Self-pay

## 2020-09-05 ENCOUNTER — Other Ambulatory Visit (HOSPITAL_COMMUNITY): Payer: Self-pay

## 2020-09-11 ENCOUNTER — Other Ambulatory Visit (HOSPITAL_COMMUNITY): Payer: Self-pay

## 2020-10-05 ENCOUNTER — Other Ambulatory Visit (HOSPITAL_COMMUNITY): Payer: Self-pay

## 2021-03-13 ENCOUNTER — Other Ambulatory Visit (HOSPITAL_COMMUNITY): Payer: Self-pay

## 2021-03-13 MED ORDER — HYDROCHLOROTHIAZIDE 12.5 MG PO TABS
12.5000 mg | ORAL_TABLET | Freq: Every morning | ORAL | 0 refills | Status: DC
  Filled 2021-03-13: qty 30, 30d supply, fill #0

## 2021-03-13 MED ORDER — NIFEDIPINE ER 60 MG PO TB24
60.0000 mg | ORAL_TABLET | Freq: Every day | ORAL | 0 refills | Status: DC
  Filled 2021-03-13: qty 30, 30d supply, fill #0

## 2021-03-21 ENCOUNTER — Other Ambulatory Visit (HOSPITAL_COMMUNITY): Payer: Self-pay

## 2021-07-12 ENCOUNTER — Emergency Department
Admission: EM | Admit: 2021-07-12 | Discharge: 2021-07-12 | Disposition: A | Discharge status: Home / Self Care | Payer: Medicaid Other | Source: Home / Self Care

## 2021-07-12 DIAGNOSIS — J020 Streptococcal pharyngitis: Secondary | ICD-10-CM

## 2021-07-12 LAB — POCT RAPID STREP A (OFFICE): Rapid Strep A Screen: POSITIVE — AB

## 2021-07-12 MED ORDER — PENICILLIN V POTASSIUM 500 MG PO TABS: 500.0000 mg | ORAL_TABLET | Freq: Two times a day (BID) | ORAL | 0 refills | Status: AC

## 2021-07-12 MED ORDER — FLUCONAZOLE 150 MG PO TABS: 150.0000 mg | ORAL_TABLET | Freq: Every day | ORAL | 0 refills | Status: DC

## 2021-07-12 NOTE — Discharge Instructions (Signed)
Take penicillin 2 times a day for 10 full days.  Do not stop early ?Take ibuprofen, Aleve, or acetaminophen for pain ?Drink lots of fluids ?Salt water gargles may help ?May use Chloraseptic spray or lozenges for pain ?Call for problems ?

## 2021-07-12 NOTE — ED Triage Notes (Signed)
Pt states that she has a sore throat. X1 day ? ?Pt states that it hurts to swallow. ? ?Pt states that she is vaccinated for covid. ?Pt states that she has had flu vaccine. ?

## 2021-07-30 NOTE — ED Provider Notes (Signed)
?[facility] ? ? ? ?CSN: [id number] ?Arrival date & time: 07/12/21  1654 ? ? ?  ? ?History   ?Chief Complaint ?Chief Complaint  ?Patient presents with  ? Sore Throat  ?  Sore throat x1 day  ? ? ?HPI ?Ashley Rush is a 36 y.o. female.  ? ?HPI ? ?Patient developed a painful sore throat since yesterday.  Denies headache or body aches.  Feels like she had a low-grade fever.  No known exposure to strep.  No cough or runny nose.  She is vaccinated for COVID.  Also has had her flu vaccine. ? ?Past Medical History:  ?Diagnosis Date  ? Coronavirus infection   ? DVT (deep venous thrombosis) ([place])   ? Gestational diabetes   ? Hypertension   ? Miscarriage   ? ? ?Patient Active Problem List  ? Diagnosis Date Noted  ? COVID-19 affecting pregnancy in third trimester 04/25/2019  ? Maternal care due to low transverse uterine scar from previous cesarean delivery 04/23/2019  ? Status post repeat low transverse cesarean section 04/23/2019  ? Abnormal human chorionic gonadotropin (hCG) 05/13/2017  ? Pregnancy of unknown anatomic location 05/13/2017  ? Edema 04/23/2013  ? Lymphedema 04/23/2013  ? Spotting between menses 12/21/2010  ? Acne 09/07/2010  ? ? ?Past Surgical History:  ?Procedure Laterality Date  ? CESAREAN SECTION  08/24/07  ? CESAREAN SECTION N/A 04/23/2019  ? Procedure: CESAREAN SECTION;  Surgeon: Janyth Contes, MD;  Location: [facility];  Service: Obstetrics;  Laterality: N/A;  ? ? ?OB History   ? ? Gravida  ?5  ? Para  ?2  ? Term  ?2  ? Preterm  ?   ? AB  ?3  ? Living  ?2  ?  ? ? SAB  ?1  ? IAB  ?2  ? Ectopic  ?   ? Multiple  ?0  ? Live Births  ?2  ?   ?  ?  ? ? ? ?Home Medications   ? ?Prior to Admission medications   ?Medication Sig Start Date End Date Taking? Authorizing Provider  ?acetaminophen (TYLENOL) 500 MG tablet Take 500 mg by mouth every 6 (six) hours as needed.    [provider]  ?apixaban (ELIQUIS) 5 MG TABS tablet Take 1 tablet (5 mg total) by mouth 2 (two) times daily.  04/27/19   Sherlyn Hay, DO  ?fluconazole (DIFLUCAN) 150 MG tablet Take 1 tablet (150 mg total) by mouth daily. -Take one pill at onset of symptoms. Take second pill on day 3 if symptoms persist. 07/12/21   Raylene Everts, MD  ?NIFEdipine (ADALAT CC) 60 MG 24 hr tablet Take 1 tablet (60 mg total) by mouth daily. 04/28/19   Sherlyn Hay, DO  ?norethindrone-ethinyl estradiol (LOESTRIN FE) 1-20 MG-MCG tablet TAKE 1 TABLET BY MOUTH ONCE DAILY. 12/15/19 12/14/20  Bovard-Stuckert, Jeral Fruit, MD  ?Prenatal Vit-Fe Fumarate-FA (PRENATAL VITAMIN) 27-0.8 MG TABS Take 1 tablet by mouth daily.    [provider]  ? ? ?Family History ?Family History  ?Problem Relation Age of Onset  ? Diabetes Mother   ? Heart disease Mother   ? Immunodeficiency Mother   ?     HIV- deceased at age 27  ? Hypertension Mother   ? Diabetes Father   ? Heart disease Father   ? Immunodeficiency Father   ?     HIV- died last year at 75  ? Hypertension Father   ? Varicose Veins Father   ?  Heart attack Father   ? ? ?Social History ?Social History  ? ?Tobacco Use  ? Smoking status: Never  ? Smokeless tobacco: Never  ?Vaping Use  ? Vaping Use: Never used  ?Substance Use Topics  ? Alcohol use: Yes  ?  Alcohol/week: 3.0 standard drinks  ?  Types: 3 Shots of liquor per week  ? Drug use: No  ? ? ? ?Allergies   ?Morphine and related and Shellfish allergy ? ? ?Review of Systems ?Review of Systems ? ?See HPI ?Physical Exam ?Triage Vital Signs ?ED Triage Vitals  ?Enc Vitals Group  ?   BP 07/12/21 1711 (!) 151/95  ?   Pulse Rate 07/12/21 1711 80  ?   Resp 07/12/21 1711 18  ?   Temp 07/12/21 1711 98.2 ?F (36.8 ?C)  ?   Temp Source 07/12/21 1711 Oral  ?   SpO2 07/12/21 1711 99 %  ?   Weight 07/12/21 1708 175 lb (79.4 kg)  ?   Height 07/12/21 1708 5\' 8"  (1.727 m)  ?   Head Circumference --   ?   Peak Flow --   ?   Pain Score 07/12/21 1708 7  ?   Pain Loc --   ?   Pain Edu? --   ?   Excl. in [place]? --   ? ?No data found. ? ?Updated Vital Signs ?BP (!)  151/95 (BP Location: Left Arm)   Pulse 80   Temp 98.2 ?F (36.8 ?C) (Oral)   Resp 18   Ht 5\' 8"  (1.727 m)   Wt 79.4 kg   LMP 07/09/2021   SpO2 99%   BMI 26.61 kg/m?  ?   ? ?Physical Exam ?Constitutional:   ?   General: She is not in acute distress. ?   Appearance: She is well-developed. She is ill-appearing.  ?HENT:  ?   Head: Normocephalic and atraumatic.  ?   Right Ear: Tympanic membrane and ear canal normal.  ?   Left Ear: Tympanic membrane and ear canal normal.  ?   Nose: No congestion or rhinorrhea.  ?   Mouth/Throat:  ?   Pharynx: Pharyngeal swelling and posterior oropharyngeal erythema present.  ?   Tonsils: No tonsillar exudate. 1+ on the right. 1+ on the left.  ?Eyes:  ?   Conjunctiva/sclera: Conjunctivae normal.  ?   Pupils: Pupils are equal, round, and reactive to light.  ?Cardiovascular:  ?   Rate and Rhythm: Normal rate.  ?Pulmonary:  ?   Effort: Pulmonary effort is normal. No respiratory distress.  ?Abdominal:  ?   General: There is no distension.  ?   Palpations: Abdomen is soft.  ?Musculoskeletal:     ?   General: Normal range of motion.  ?   Cervical back: Normal range of motion.  ?Lymphadenopathy:  ?   Cervical: Cervical adenopathy present.  ?Skin: ?   General: Skin is warm and dry.  ?Neurological:  ?   Mental Status: She is alert.  ? ? ? ?UC Treatments / Results  ?Labs ?(all labs ordered are listed, but only abnormal results are displayed) ?Labs Reviewed  ?POCT RAPID STREP A (OFFICE) - Abnormal; Notable for the following components:  ?    Result Value  ? Rapid Strep A Screen Positive (*)   ? All other components within normal limits  ? ? ?EKG ? ? ?Radiology ?No results found. ? ?Procedures ?Procedures (including critical care time) ? ?Medications Ordered in UC ?Medications - No data  to display ? ?Initial Impression / Assessment and Plan / UC Course  ?I have reviewed the triage vital signs and the nursing notes. ? ?Pertinent labs & imaging results that were available during my care of the  patient were reviewed by me and considered in my medical decision making (see chart for details). ? ?  ? ?Final Clinical Impressions(s) / UC Diagnoses  ? ?Final diagnoses:  ?Strep throat  ? ? ? ?Discharge Instructions   ? ?  ?Take penicillin 2 times a day for 10 full days.  Do not stop early ?Take ibuprofen, Aleve, or acetaminophen for pain ?Drink lots of fluids ?Salt water gargles may help ?May use Chloraseptic spray or lozenges for pain ?Call for problems ? ? ?ED Prescriptions   ? ? Medication Sig Dispense Auth. Provider  ? fluconazole (DIFLUCAN) 150 MG tablet Take 1 tablet (150 mg total) by mouth daily. -Take one pill at onset of symptoms. Take second pill on day 3 if symptoms persist. 2 tablet Raylene Everts, MD  ? penicillin v potassium (VEETID) 500 MG tablet Take 1 tablet (500 mg total) by mouth 2 (two) times daily for 10 days. 20 tablet Raylene Everts, MD  ? ?  ? ?PDMP not reviewed this encounter. ?  ?Raylene Everts, MD ?07/30/21 1026 ? ?

## 2021-10-08 ENCOUNTER — Emergency Department
Admission: EM | Admit: 2021-10-08 | Discharge: 2021-10-08 | Disposition: A | Discharge status: Home / Self Care | Payer: Medicaid Other | Source: Home / Self Care

## 2021-10-08 ENCOUNTER — Encounter: Payer: Self-pay | Admitting: Emergency Medicine

## 2021-10-08 DIAGNOSIS — R300 Dysuria: Secondary | ICD-10-CM

## 2021-10-08 DIAGNOSIS — N390 Urinary tract infection, site not specified: Secondary | ICD-10-CM | POA: Diagnosis not present

## 2021-10-08 LAB — POCT URINALYSIS DIP (MANUAL ENTRY)
Bilirubin, UA: NEGATIVE
Blood, UA: NEGATIVE
Glucose, UA: NEGATIVE mg/dL
Ketones, POC UA: NEGATIVE mg/dL
Nitrite, UA: NEGATIVE
Protein Ur, POC: NEGATIVE mg/dL
Spec Grav, UA: 1.01 (ref 1.010–1.025)
Urobilinogen, UA: 0.2 E.U./dL
pH, UA: 7 (ref 5.0–8.0)

## 2021-10-08 LAB — POCT URINE PREGNANCY: Preg Test, Ur: NEGATIVE

## 2021-10-08 MED ORDER — FLUCONAZOLE 150 MG PO TABS: 150.0000 mg | ORAL_TABLET | Freq: Every day | ORAL | 0 refills | Status: DC

## 2021-10-08 MED ORDER — NITROFURANTOIN MONOHYD MACRO 100 MG PO CAPS: 100.0000 mg | ORAL_CAPSULE | Freq: Two times a day (BID) | ORAL | 0 refills | Status: DC

## 2021-10-08 NOTE — ED Triage Notes (Signed)
Patient c/o lower abdominal pain x 2-3 days, dysuria x 1 day, hematuria.  Denies any nausea, vomiting or diarrhea.  No OTC meds.

## 2021-10-09 ENCOUNTER — Telehealth: Payer: Self-pay | Admitting: Emergency Medicine

## 2021-10-09 ENCOUNTER — Telehealth: Payer: Self-pay | Admitting: Urgent Care

## 2021-10-09 DIAGNOSIS — R300 Dysuria: Secondary | ICD-10-CM

## 2021-10-09 MED ORDER — NITROFURANTOIN MONOHYD MACRO 100 MG PO CAPS: 100.0000 mg | ORAL_CAPSULE | Freq: Two times a day (BID) | ORAL | 0 refills | Status: DC

## 2021-10-09 NOTE — Telephone Encounter (Signed)
PT called, spoke with RN Graylon Good. Macrobid was sent in yesterday to [facility] when their power was out. Apparently script never got there. Pt tried again this morning and script still not available. Pt requested we re-send in so she can start treatment this evening. Cancelled previous order and called in new Rx for macrobid 100mg  BID x 5 days.

## 2021-10-09 NOTE — ED Provider Notes (Signed)
[facility]    CSN: [id number] Arrival date & time: 10/08/21  1740      History   Chief Complaint Chief Complaint  Patient presents with   Abdominal Pain    HPI Ashley Rush is a 36 y.o. female.   HPI Patient presents with a 2 to 3-day history of lower abdominal pain, dysuria x1 day and she reports yesterday physical exam blood in her urine.  She denies any nausea, vomiting or diarrhea.  She has a history of previous UTIs.  She is afebrile.  Past Medical History:  Diagnosis Date   Coronavirus infection    DVT (deep venous thrombosis) ([facility])    Gestational diabetes    Hypertension    Miscarriage     Patient Active Problem List   Diagnosis Date Noted   COVID-19 affecting pregnancy in third trimester 04/25/2019   Maternal care due to low transverse uterine scar from previous cesarean delivery 04/23/2019   Status post repeat low transverse cesarean section 04/23/2019   Abnormal human chorionic gonadotropin (hCG) 05/13/2017   Pregnancy of unknown anatomic location 05/13/2017   Edema 04/23/2013   Lymphedema 04/23/2013   Spotting between menses 12/21/2010   Acne 09/07/2010    Past Surgical History:  Procedure Laterality Date   CESAREAN SECTION  08/24/07   CESAREAN SECTION N/A 04/23/2019   Procedure: CESAREAN SECTION;  Surgeon: Sherian ReinBovard-Stuckert, Jody, MD;  Location: [facility];  Service: Obstetrics;  Laterality: N/A;    OB History     Gravida  5   Para  2   Term  2   Preterm      AB  3   Living  2      SAB  1   IAB  2   Ectopic      Multiple  0   Live Births  2            Home Medications    Prior to Admission medications   Medication Sig Start Date End Date Taking? Authorizing Provider  acetaminophen (TYLENOL) 500 MG tablet Take 500 mg by mouth every 6 (six) hours as needed.    [provider]  apixaban (ELIQUIS) 5 MG TABS tablet Take 1 tablet (5 mg total) by mouth 2 (two) times daily. 04/27/19   Pryor OchoaBanga, Cecilia  Worema, DO  fluconazole (DIFLUCAN) 150 MG tablet Take 1 tablet (150 mg total) by mouth daily. -Take one pill at onset of symptoms. Take second pill on day 3 if symptoms persist. 10/08/21   Bing NeighborsHarris, Meda Dudzinski S, FNP  NIFEdipine (ADALAT CC) 60 MG 24 hr tablet Take 1 tablet (60 mg total) by mouth daily. 04/28/19   Banga, Sharol Givenecilia Worema, DO  nitrofurantoin, macrocrystal-monohydrate, (MACROBID) 100 MG capsule Take 1 capsule (100 mg total) by mouth 2 (two) times daily. 10/09/21   Crain, Whitney L, PA  norethindrone-ethinyl estradiol (LOESTRIN FE) 1-20 MG-MCG tablet TAKE 1 TABLET BY MOUTH ONCE DAILY. 12/15/19 12/14/20  Bovard-Stuckert, Augusto GambleJody, MD  Prenatal Vit-Fe Fumarate-FA (PRENATAL VITAMIN) 27-0.8 MG TABS Take 1 tablet by mouth daily.    [provider]    Family History Family History  Problem Relation Age of Onset   Diabetes Mother    Heart disease Mother    Immunodeficiency Mother        HIV- deceased at age 37   Hypertension Mother    Diabetes Father    Heart disease Father    Immunodeficiency Father        HIV- died  last year at 61   Hypertension Father    Varicose Veins Father    Heart attack Father     Social History Social History   Tobacco Use   Smoking status: Never   Smokeless tobacco: Never  Vaping Use   Vaping Use: Never used  Substance Use Topics   Alcohol use: Yes    Alcohol/week: 3.0 standard drinks    Types: 3 Shots of liquor per week   Drug use: No     Allergies   Morphine and related and Shellfish allergy   Review of Systems Review of Systems Pertinent negatives listed in HPI   Physical Exam Triage Vital Signs ED Triage Vitals  Enc Vitals Group     BP 10/08/21 1755 (!) 164/113     Pulse Rate 10/08/21 1755 90     Resp 10/08/21 1755 18     Temp 10/08/21 1755 99.1 F (37.3 C)     Temp Source 10/08/21 1755 Oral     SpO2 10/08/21 1755 96 %     Weight 10/08/21 1756 175 lb (79.4 kg)     Height 10/08/21 1756 5\' 7"  (1.702 m)     Head Circumference --       Peak Flow --      Pain Score 10/08/21 1756 7     Pain Loc --      Pain Edu? --      Excl. in [place]? --    No data found.  Updated Vital Signs BP (!) 164/113 (BP Location: Right Arm)   Pulse 90   Temp 99.1 F (37.3 C) (Oral)   Resp 18   Ht 5\' 7"  (1.702 m)   Wt 175 lb (79.4 kg)   LMP 09/20/2021 (Exact Date)   SpO2 96%   BMI 27.41 kg/m   Visual Acuity Right Eye Distance:   Left Eye Distance:   Bilateral Distance:    Right Eye Near:   Left Eye Near:    Bilateral Near:     Physical Exam General appearance: Alert, well developed, well nourished, cooperative and in no distress Head: Normocephalic, without obvious abnormality, atraumatic Respiratory: Respirations even and unlabored, normal respiratory rate Heart: rate and rhythm normal.   CVA:  no flank pain Extremities: No gross deformities Skin: Skin color, texture, turgor normal. No rashes seen  Psych: Appropriate mood and affect.   UC Treatments / Results  Labs (all labs ordered are listed, but only abnormal results are displayed) Labs Reviewed  POCT URINALYSIS DIP (MANUAL ENTRY) - Abnormal; Notable for the following components:      Result Value   Color, UA light yellow (*)    Clarity, UA cloudy (*)    Leukocytes, UA Trace (*)    All other components within normal limits  URINE CULTURE  POCT URINE PREGNANCY    EKG   Radiology No results found.  Procedures Procedures (including critical care time)  Medications Ordered in UC Medications - No data to display  Initial Impression / Assessment and Plan / UC Course  I have reviewed the triage vital signs and the nursing notes.  Pertinent labs & imaging results that were available during my care of the patient were reviewed by me and considered in my medical decision making (see chart for details).      UA abnormal and findings consistent with UTI.  Empiric antibiotic treatment initiated with Macrobid 100 mg twice daily x 5 days. Encouraged increase  intake of water. Recommended cranberry tablets, wiping  from front to back, and urinating prior to and after cortis to reduce risk for reinfection. Urine culture pending.  ER if symptoms become severe.  Follow-up with PCP if symptoms do not completely resolve.  Final Clinical Impressions(s) / UC Diagnoses   Final diagnoses:  Dysuria  Acute UTI (urinary tract infection)   Discharge Instructions   None    ED Prescriptions     Medication Sig Dispense Auth. Provider   fluconazole (DIFLUCAN) 150 MG tablet Take 1 tablet (150 mg total) by mouth daily. -Take one pill at onset of symptoms. Take second pill on day 3 if symptoms persist. 2 tablet Bing Neighbors, FNP   nitrofurantoin, macrocrystal-monohydrate, (MACROBID) 100 MG capsule Take 1 capsule (100 mg total) by mouth 2 (two) times daily for 5 days. 10 capsule Bing Neighbors, FNP      PDMP not reviewed this encounter.   Bing Neighbors, FNP 10/09/21 [phone]

## 2021-10-10 ENCOUNTER — Other Ambulatory Visit (HOSPITAL_COMMUNITY): Payer: Self-pay

## 2021-10-11 ENCOUNTER — Telehealth: Payer: Self-pay

## 2021-10-11 MED ORDER — FLUCONAZOLE 150 MG PO TABS: ORAL_TABLET | ORAL | 0 refills | Status: DC

## 2021-10-11 NOTE — Telephone Encounter (Signed)
Patient requested Diflucan.  Diflucan sent to pharmacy as requested.

## 2021-10-12 ENCOUNTER — Telehealth (HOSPITAL_COMMUNITY): Payer: Self-pay | Admitting: Emergency Medicine

## 2021-10-12 LAB — URINE CULTURE
MICRO NUMBER:: 13486690
SPECIMEN QUALITY:: ADEQUATE

## 2021-10-12 MED ORDER — CEPHALEXIN 500 MG PO CAPS: 500.0000 mg | ORAL_CAPSULE | Freq: Two times a day (BID) | ORAL | 0 refills | Status: AC

## 2021-10-22 ENCOUNTER — Other Ambulatory Visit (HOSPITAL_COMMUNITY): Payer: Self-pay

## 2021-10-23 ENCOUNTER — Other Ambulatory Visit (HOSPITAL_COMMUNITY): Payer: Self-pay

## 2022-08-26 ENCOUNTER — Ambulatory Visit
Admission: EM | Admit: 2022-08-26 | Discharge: 2022-08-26 | Disposition: A | Discharge status: Home / Self Care | Payer: Commercial Managed Care - PPO | Attending: Family Medicine | Admitting: Family Medicine

## 2022-08-26 ENCOUNTER — Encounter: Payer: Self-pay | Admitting: Emergency Medicine

## 2022-08-26 DIAGNOSIS — I10 Essential (primary) hypertension: Secondary | ICD-10-CM | POA: Insufficient documentation

## 2022-08-26 DIAGNOSIS — R0981 Nasal congestion: Secondary | ICD-10-CM | POA: Diagnosis present

## 2022-08-26 DIAGNOSIS — J039 Acute tonsillitis, unspecified: Secondary | ICD-10-CM | POA: Insufficient documentation

## 2022-08-26 DIAGNOSIS — Z20818 Contact with and (suspected) exposure to other bacterial communicable diseases: Secondary | ICD-10-CM | POA: Diagnosis not present

## 2022-08-26 LAB — POCT RAPID STREP A (OFFICE): Rapid Strep A Screen: NEGATIVE

## 2022-08-26 MED ORDER — AMOXICILLIN 875 MG PO TABS: 875.0000 mg | ORAL_TABLET | Freq: Two times a day (BID) | ORAL | 0 refills | Status: DC

## 2022-08-26 NOTE — ED Triage Notes (Addendum)
Sore throat, cough, nasal congestion, left ear pain since Thursday. Daughter sick with viral URI last week. Denies fever, SOB, CP, palpitations, wheezing, abdominal pain, N/V/D, headache, generalized body aches. Not taking OTC meds at home. BP after recheck- 177/114

## 2022-08-26 NOTE — ED Provider Notes (Signed)
[facility]    CSN: [id number] Arrival date & time: 08/26/22  1917      History   Chief Complaint Chief Complaint  Patient presents with   Sore Throat    HPI Ashley Rush is a 37 y.o. female.   HPI  Patient states that she has sore throat cough and nasal congestion.  Left ear pain.  She states that her daughter was sick last week.  She is being treated for strep.  Patient has a very painful sore throat and some ear pain.  No fever or chills.  No headache or body ache  Past Medical History:  Diagnosis Date   Coronavirus infection    DVT (deep venous thrombosis)    Gestational diabetes    Hypertension    Miscarriage     Patient Active Problem List   Diagnosis Date Noted   COVID-19 affecting pregnancy in third trimester 04/25/2019   Maternal care due to low transverse uterine scar from previous cesarean delivery 04/23/2019   Status post repeat low transverse cesarean section 04/23/2019   Abnormal human chorionic gonadotropin (hCG) 05/13/2017   Pregnancy of unknown anatomic location 05/13/2017   Edema 04/23/2013   Lymphedema 04/23/2013   Spotting between menses 12/21/2010   Acne 09/07/2010    Past Surgical History:  Procedure Laterality Date   CESAREAN SECTION  08/24/07   CESAREAN SECTION N/A 04/23/2019   Procedure: CESAREAN SECTION;  Surgeon: Sherian Rein, MD;  Location: [facility];  Service: Obstetrics;  Laterality: N/A;    OB History     Gravida  5   Para  2   Term  2   Preterm      AB  3   Living  2      SAB  1   IAB  2   Ectopic      Multiple  0   Live Births  2            Home Medications    Prior to Admission medications   Medication Sig Start Date End Date Taking? Authorizing Provider  amoxicillin (AMOXIL) 875 MG tablet Take 1 tablet (875 mg total) by mouth 2 (two) times daily. 08/26/22  Yes Eustace Moore, MD    Family History Family History  Problem Relation Age of Onset   Diabetes Mother     Heart disease Mother    Immunodeficiency Mother        HIV- deceased at age 62   Hypertension Mother    Diabetes Father    Heart disease Father    Immunodeficiency Father        HIV- died last year at 42   Hypertension Father    Varicose Veins Father    Heart attack Father     Social History Social History   Tobacco Use   Smoking status: Never   Smokeless tobacco: Never  Vaping Use   Vaping Use: Never used  Substance Use Topics   Alcohol use: Yes    Alcohol/week: 3.0 standard drinks of alcohol    Types: 3 Shots of liquor per week   Drug use: No     Allergies   Morphine and related and Shellfish allergy   Review of Systems Review of Systems See HPI  Physical Exam Triage Vital Signs ED Triage Vitals  Enc Vitals Group     BP 08/26/22 1943 (!) 177/114     Pulse Rate 08/26/22 1943 83     Resp 08/26/22 1943  16     Temp 08/26/22 1943 98.1 F (36.7 C)     Temp Source 08/26/22 1943 Oral     SpO2 08/26/22 1943 97 %     Weight --      Height --      Head Circumference --      Peak Flow --      Pain Score 08/26/22 1946 5     Pain Loc --      Pain Edu? --      Excl. in [place]? --    No data found.  Updated Vital Signs BP (!) 177/114 (BP Location: Left Arm)   Pulse 83   Temp 98.1 F (36.7 C) (Oral)   Resp 16   SpO2 97%      Physical Exam Constitutional:      General: She is not in acute distress.    Appearance: She is well-developed.  HENT:     Head: Normocephalic and atraumatic.     Right Ear: Tympanic membrane and ear canal normal.     Left Ear: Tympanic membrane and ear canal normal.     Nose: No congestion.     Mouth/Throat:     Mouth: Mucous membranes are moist.     Pharynx: Uvula midline. Pharyngeal swelling and posterior oropharyngeal erythema present.     Tonsils: Tonsillar exudate present. 3+ on the right. 3+ on the left.  Eyes:     Conjunctiva/sclera: Conjunctivae normal.     Pupils: Pupils are equal, round, and reactive to light.   Cardiovascular:     Rate and Rhythm: Normal rate.  Pulmonary:     Effort: Pulmonary effort is normal. No respiratory distress.  Abdominal:     General: There is no distension.     Palpations: Abdomen is soft.  Musculoskeletal:        General: Normal range of motion.     Cervical back: Normal range of motion.  Lymphadenopathy:     Cervical: Cervical adenopathy present.  Skin:    General: Skin is warm and dry.  Neurological:     Mental Status: She is alert.      UC Treatments / Results  Labs (all labs ordered are listed, but only abnormal results are displayed) Labs Reviewed  CULTURE, GROUP A STREP [facility])  POCT RAPID STREP A (OFFICE)    EKG   Radiology No results found.  Procedures Procedures (including critical care time)  Medications Ordered in UC Medications - No data to display  Initial Impression / Assessment and Plan / UC Course  I have reviewed the triage vital signs and the nursing notes.  Pertinent labs & imaging results that were available during my care of the patient were reviewed by me and considered in my medical decision making (see chart for details).    Final Clinical Impressions(s) / UC Diagnoses   Final diagnoses:  Elevated blood pressure reading in office with diagnosis of hypertension  Acute tonsillitis, unspecified etiology  Exposure to strep throat     Discharge Instructions      Take amoxicillin 2 times a day Drink lots of water Take Tylenol or ibuprofen for pain and fever May use chlor septic spray or salt water gargles for the throat pain  Your throat swab has been sent to the lab for culture.  You will be called with any change in treatment is needed     ED Prescriptions     Medication Sig Dispense Auth. Provider   amoxicillin (AMOXIL)  875 MG tablet Take 1 tablet (875 mg total) by mouth 2 (two) times daily. 14 tablet Eustace Moore, MD      PDMP not reviewed this encounter.   Eustace Moore, MD 08/26/22  Elisha Ponder

## 2022-08-26 NOTE — Discharge Instructions (Signed)
Take amoxicillin 2 times a day Drink lots of water Take Tylenol or ibuprofen for pain and fever May use chlor septic spray or salt water gargles for the throat pain  Your throat swab has been sent to the lab for culture.  You will be called with any change in treatment is needed

## 2022-08-30 LAB — CULTURE, GROUP A STREP (THRC)

## 2022-09-10 ENCOUNTER — Ambulatory Visit
Admission: EM | Admit: 2022-09-10 | Discharge: 2022-09-10 | Disposition: A | Discharge status: Home / Self Care | Payer: Commercial Managed Care - PPO | Attending: Physician Assistant | Admitting: Physician Assistant

## 2022-09-10 DIAGNOSIS — J029 Acute pharyngitis, unspecified: Secondary | ICD-10-CM | POA: Insufficient documentation

## 2022-09-10 DIAGNOSIS — R030 Elevated blood-pressure reading, without diagnosis of hypertension: Secondary | ICD-10-CM | POA: Insufficient documentation

## 2022-09-10 LAB — POCT MONO SCREEN (KUC): Mono, POC: NEGATIVE

## 2022-09-10 LAB — POCT RAPID STREP A (OFFICE): Rapid Strep A Screen: NEGATIVE

## 2022-09-10 MED ORDER — LIDOCAINE VISCOUS HCL 2 % MT SOLN: 15.0000 mL | Freq: Four times a day (QID) | OROMUCOSAL | 0 refills | Status: DC | PRN

## 2022-09-10 NOTE — Discharge Instructions (Signed)
Your strep and mono were negative.  We will send this for culture but I do not think you need antibiotics at this time.  Given your recurrent sore throat I would recommend that you follow-up with ENT.  Call them to schedule an appointment.  In the meantime alternate Tylenol and gargle with warm salt water.  Use viscous lidocaine for additional symptom relief.  Do not eat or drink immediately after using this medication as it can increase your risk of choking.  If you have any worsening symptoms including increasing pain, fever, difficulty swallowing, swelling of your throat, shortness of breath, muffled voice you need to be seen immediately.  Your blood pressure is very elevated.  Please monitor this at home.  If this is persistently above 140/90 you need to be reevaluated by either [place] or your primary care so we can start medication.  Avoid decongestants, caffeine, sodium, NSAIDs (aspirin, ibuprofen/Advil, naproxen/Aleve).  If you develop any chest pain, shortness of breath, headache, vision change, dizziness in the setting of high blood pressure you need to be seen immediately.

## 2022-09-10 NOTE — ED Provider Notes (Signed)
[facility]    CSN: [id number] Arrival date & time: 09/10/22  1939      History   Chief Complaint No chief complaint on file.   HPI Ashley Rush is a 37 y.o. female.   Patient presents today with a 1 day history of recurrent severe left-sided sore throat.  She reports that pain is rated 7 on a 0-10 pain scale, described as sharp, worse with swallowing or opening her mouth, no alleviating factors identified.  She has not tried any over-the-counter medication for symptom management.  She does report some mild nasal congestion but denies additional symptoms including fever, cough, muffled voice, swelling of her throat, shortness of breath.  She is eating and drinking normally.  She was seen by our clinic on 08/26/2022 at which point she tested negative for strep but was treated for acute tonsillitis with amoxicillin.  Reports her symptoms did improve with this antibiotic until recurrence.  She did not dispose of her toothbrush following initiation of antibiotics.  Denies any known sick contacts.  She is confident she is not pregnant.  Denies additional antibiotics in the past 90 days.    Past Medical History:  Diagnosis Date   Coronavirus infection    DVT (deep venous thrombosis) (HCC)    Gestational diabetes    Hypertension    Miscarriage     Patient Active Problem List   Diagnosis Date Noted   COVID-19 affecting pregnancy in third trimester 04/25/2019   Maternal care due to low transverse uterine scar from previous cesarean delivery 04/23/2019   Status post repeat low transverse cesarean section 04/23/2019   Abnormal human chorionic gonadotropin (hCG) 05/13/2017   Pregnancy of unknown anatomic location 05/13/2017   Edema 04/23/2013   Lymphedema 04/23/2013   Spotting between menses 12/21/2010   Acne 09/07/2010    Past Surgical History:  Procedure Laterality Date   CESAREAN SECTION  08/24/07   CESAREAN SECTION N/A 04/23/2019   Procedure: CESAREAN SECTION;   Surgeon: Sherian Rein, MD;  Location: [facility];  Service: Obstetrics;  Laterality: N/A;    OB History     Gravida  5   Para  2   Term  2   Preterm      AB  3   Living  2      SAB  1   IAB  2   Ectopic      Multiple  0   Live Births  2            Home Medications    Prior to Admission medications   Medication Sig Start Date End Date Taking? Authorizing Provider  lidocaine (XYLOCAINE) 2 % solution Use as directed 15 mLs in the mouth or throat every 6 (six) hours as needed for mouth pain. 09/10/22  Yes Maleiya Pergola, Noberto Retort, PA-C    Family History Family History  Problem Relation Age of Onset   Diabetes Mother    Heart disease Mother    Immunodeficiency Mother        HIV- deceased at age 102   Hypertension Mother    Diabetes Father    Heart disease Father    Immunodeficiency Father        HIV- died last year at 87   Hypertension Father    Varicose Veins Father    Heart attack Father     Social History Social History   Tobacco Use   Smoking status: Never   Smokeless tobacco: Never  Vaping  Use   Vaping Use: Never used  Substance Use Topics   Alcohol use: Yes    Alcohol/week: 3.0 standard drinks of alcohol    Types: 3 Shots of liquor per week   Drug use: No     Allergies   Morphine and related and Shellfish allergy   Review of Systems Review of Systems  Constitutional:  Positive for activity change. Negative for appetite change, fatigue and fever.  HENT:  Positive for congestion, sore throat and trouble swallowing. Negative for sinus pressure, sneezing and voice change.   Respiratory:  Negative for cough and shortness of breath.   Cardiovascular:  Negative for chest pain.  Gastrointestinal:  Negative for abdominal pain, diarrhea, nausea and vomiting.  Neurological:  Negative for dizziness, light-headedness and headaches.     Physical Exam Triage Vital Signs ED Triage Vitals  Enc Vitals Group     BP 09/10/22 1947 (!) 165/17      Pulse Rate 09/10/22 1947 86     Resp 09/10/22 1947 16     Temp 09/10/22 1947 98.7 F (37.1 C)     Temp src --      SpO2 09/10/22 1947 98 %     Weight --      Height --      Head Circumference --      Peak Flow --      Pain Score 09/10/22 1946 7     Pain Loc --      Pain Edu? --      Excl. in GC? --    No data found.  Updated Vital Signs BP (!) 167/110   Pulse 86   Temp 98.7 F (37.1 C)   Resp 16   SpO2 98%   Visual Acuity Right Eye Distance:   Left Eye Distance:   Bilateral Distance:    Right Eye Near:   Left Eye Near:    Bilateral Near:     Physical Exam Vitals reviewed.  Constitutional:      General: She is awake. She is not in acute distress.    Appearance: Normal appearance. She is well-developed. She is not ill-appearing.     Comments: Very pleasant female appears stated age in no acute distress sitting comfortably in exam room  HENT:     Head: Normocephalic and atraumatic.     Right Ear: Tympanic membrane, ear canal and external ear normal. Tympanic membrane is not erythematous or bulging.     Left Ear: Ear canal and external ear normal. A middle ear effusion is present. Tympanic membrane is not erythematous or bulging.     Nose:     Right Sinus: No maxillary sinus tenderness or frontal sinus tenderness.     Left Sinus: No maxillary sinus tenderness or frontal sinus tenderness.     Mouth/Throat:     Pharynx: Uvula midline. Posterior oropharyngeal erythema present. No oropharyngeal exudate.     Tonsils: No tonsillar exudate or tonsillar abscesses. 1+ on the right. 1+ on the left.  Cardiovascular:     Rate and Rhythm: Normal rate and regular rhythm.     Heart sounds: Normal heart sounds, S1 normal and S2 normal. No murmur heard. Pulmonary:     Effort: Pulmonary effort is normal.     Breath sounds: Normal breath sounds. No wheezing, rhonchi or rales.     Comments: Clear to auscultation bilaterally Lymphadenopathy:     Head:     Right side of head: No  submental, submandibular or tonsillar  adenopathy.     Left side of head: Submandibular adenopathy present. No submental or tonsillar adenopathy.     Cervical: No cervical adenopathy.  Psychiatric:        Behavior: Behavior is cooperative.      UC Treatments / Results  Labs (all labs ordered are listed, but only abnormal results are displayed) Labs Reviewed  CULTURE, GROUP A STREP [facility])  POCT RAPID STREP A (OFFICE)  POCT MONO SCREEN [facility])    EKG   Radiology No results found.  Procedures Procedures (including critical care time)  Medications Ordered in UC Medications - No data to display  Initial Impression / Assessment and Plan / UC Course  I have reviewed the triage vital signs and the nursing notes.  Pertinent labs & imaging results that were available during my care of the patient were reviewed by me and considered in my medical decision making (see chart for details).     Patient is well-appearing, afebrile, nontoxic, nontachycardic.  Strep testing was obtained and was negative.  Will send this further culture but defer antibiotics until culture results are available.  Mono testing was obtained and was negative.  We discussed that based on her clinical presentation today I do not think she needs antibiotics.  She was encouraged to gargle with warm salt water and use Tylenol for breakthrough pain.  She was given viscous lidocaine to help manage sore throat symptoms but discussed that she should not eat or drink immediately after using this medication as it increases the risk of choking.  Given her recurrent symptoms I did recommend she follow-up with an ENT and was given contact information for local provider with instruction to call to schedule an appointment.  We discussed that if she has any worsening or changing symptoms including worsening sore throat, fever, difficulty swallowing, muffled voice, swelling of her throat, shortness of breath she needs to be seen  immediately.  Strict return precautions given.  Work excuse note provided.  Blood pressure is elevated today.  Patient does have a history of essential hypertension but is not currently taking any medication for this.  Recommended that she monitor her blood pressure at home and if this remains above 140/90 she needs to be reevaluated to consider initiation of medication.  She is to avoid NSAIDs, decongestants, caffeine, sodium.  If she has any worsening or changing symptoms including chest pain, shortness of breath, headache, vision change, dizziness in setting of high blood pressure she needs to be seen immediately.  Strict return precautions given.  Final Clinical Impressions(s) / UC Diagnoses   Final diagnoses:  Sore throat  Acute pharyngitis, unspecified etiology  Elevated blood pressure reading     Discharge Instructions      Your strep and mono were negative.  We will send this for culture but I do not think you need antibiotics at this time.  Given your recurrent sore throat I would recommend that you follow-up with ENT.  Call them to schedule an appointment.  In the meantime alternate Tylenol and gargle with warm salt water.  Use viscous lidocaine for additional symptom relief.  Do not eat or drink immediately after using this medication as it can increase your risk of choking.  If you have any worsening symptoms including increasing pain, fever, difficulty swallowing, swelling of your throat, shortness of breath, muffled voice you need to be seen immediately.  Your blood pressure is very elevated.  Please monitor this at home.  If this is persistently above  140/90 you need to be reevaluated by either [place] or your primary care so we can start medication.  Avoid decongestants, caffeine, sodium, NSAIDs (aspirin, ibuprofen/Advil, naproxen/Aleve).  If you develop any chest pain, shortness of breath, headache, vision change, dizziness in the setting of high blood pressure you need to be seen  immediately.     ED Prescriptions     Medication Sig Dispense Auth. Provider   lidocaine (XYLOCAINE) 2 % solution Use as directed 15 mLs in the mouth or throat every 6 (six) hours as needed for mouth pain. 100 mL Tesla Bochicchio K, PA-C      PDMP not reviewed this encounter.   Jeani Hawking, PA-C 09/10/22 2014

## 2022-09-10 NOTE — ED Triage Notes (Signed)
Pt presents to [facility] with co of difficulty swallowing since this morning. Pt reports she looked in the the back of her throat and she saw something concerning. Recent infecting with antibiotic treatment.

## 2022-09-13 LAB — CULTURE, GROUP A STREP (THRC)

## 2022-12-24 DIAGNOSIS — Z202 Contact with and (suspected) exposure to infections with a predominantly sexual mode of transmission: Secondary | ICD-10-CM | POA: Diagnosis not present

## 2022-12-24 DIAGNOSIS — B3731 Acute candidiasis of vulva and vagina: Secondary | ICD-10-CM | POA: Diagnosis not present

## 2022-12-24 DIAGNOSIS — N760 Acute vaginitis: Secondary | ICD-10-CM | POA: Diagnosis not present

## 2022-12-24 DIAGNOSIS — Z3009 Encounter for other general counseling and advice on contraception: Secondary | ICD-10-CM | POA: Diagnosis not present

## 2022-12-24 DIAGNOSIS — N898 Other specified noninflammatory disorders of vagina: Secondary | ICD-10-CM | POA: Diagnosis not present

## 2022-12-24 DIAGNOSIS — R030 Elevated blood-pressure reading, without diagnosis of hypertension: Secondary | ICD-10-CM | POA: Diagnosis not present

## 2023-01-01 DIAGNOSIS — Z30430 Encounter for insertion of intrauterine contraceptive device: Secondary | ICD-10-CM | POA: Diagnosis not present

## 2023-02-09 ENCOUNTER — Emergency Department (HOSPITAL_BASED_OUTPATIENT_CLINIC_OR_DEPARTMENT_OTHER)
Admission: EM | Admit: 2023-02-09 | Discharge: 2023-02-09 | Disposition: A | Discharge status: Home / Self Care | Payer: Commercial Managed Care - PPO | Attending: Emergency Medicine | Admitting: Emergency Medicine

## 2023-02-09 ENCOUNTER — Emergency Department (HOSPITAL_BASED_OUTPATIENT_CLINIC_OR_DEPARTMENT_OTHER): Payer: Commercial Managed Care - PPO

## 2023-02-09 ENCOUNTER — Encounter (HOSPITAL_BASED_OUTPATIENT_CLINIC_OR_DEPARTMENT_OTHER): Payer: Self-pay

## 2023-02-09 ENCOUNTER — Other Ambulatory Visit: Payer: Self-pay

## 2023-02-09 DIAGNOSIS — Z86718 Personal history of other venous thrombosis and embolism: Secondary | ICD-10-CM | POA: Diagnosis not present

## 2023-02-09 DIAGNOSIS — M5441 Lumbago with sciatica, right side: Secondary | ICD-10-CM | POA: Insufficient documentation

## 2023-02-09 DIAGNOSIS — M79604 Pain in right leg: Secondary | ICD-10-CM | POA: Diagnosis not present

## 2023-02-09 DIAGNOSIS — M7989 Other specified soft tissue disorders: Secondary | ICD-10-CM | POA: Diagnosis not present

## 2023-02-09 DIAGNOSIS — I10 Essential (primary) hypertension: Secondary | ICD-10-CM | POA: Diagnosis not present

## 2023-02-09 DIAGNOSIS — R030 Elevated blood-pressure reading, without diagnosis of hypertension: Secondary | ICD-10-CM | POA: Insufficient documentation

## 2023-02-09 DIAGNOSIS — M545 Low back pain, unspecified: Secondary | ICD-10-CM | POA: Diagnosis present

## 2023-02-09 MED ORDER — METHOCARBAMOL 500 MG PO TABS
500.0000 mg | ORAL_TABLET | Freq: Two times a day (BID) | ORAL | 0 refills | Status: DC
Start: 2023-02-09 — End: 2023-03-18

## 2023-02-09 MED ORDER — KETOROLAC TROMETHAMINE 30 MG/ML IJ SOLN
30.0000 mg | Freq: Once | INTRAMUSCULAR | Status: AC
  Administered 2023-02-09: 30 mg via INTRAMUSCULAR
  Filled 2023-02-09: qty 1

## 2023-02-09 MED ORDER — PREDNISONE 50 MG PO TABS
60.0000 mg | ORAL_TABLET | Freq: Once | ORAL | Status: AC
  Administered 2023-02-09: 60 mg via ORAL
  Filled 2023-02-09: qty 1

## 2023-02-09 MED ORDER — LIDOCAINE 5 % EX PTCH
1.0000 | MEDICATED_PATCH | CUTANEOUS | Status: DC
  Administered 2023-02-09: 1 via TRANSDERMAL
  Filled 2023-02-09: qty 1

## 2023-02-09 MED ORDER — OXYCODONE-ACETAMINOPHEN 5-325 MG PO TABS
1.0000 | ORAL_TABLET | Freq: Once | ORAL | Status: AC
  Administered 2023-02-09: 1 via ORAL
  Filled 2023-02-09: qty 1

## 2023-02-09 MED ORDER — LIDOCAINE 5 % EX PTCH
1.0000 | MEDICATED_PATCH | CUTANEOUS | 0 refills | Status: DC
Start: 2023-02-09 — End: 2023-03-25

## 2023-02-09 MED ORDER — PREDNISONE 50 MG PO TABS
50.0000 mg | ORAL_TABLET | Freq: Every day | ORAL | 0 refills | Status: AC
Start: 2023-02-09 — End: 2023-02-14

## 2023-02-09 MED ORDER — OXYCODONE HCL 5 MG PO TABS: 5.0000 mg | ORAL_TABLET | ORAL | 0 refills | Status: DC | PRN

## 2023-02-09 NOTE — Discharge Instructions (Addendum)
It was a pleasure taking care of you here in the emergency department   You most likely have sciatica.  We are treating this with a few medications.  Take as prescribed.  Please note that the Roxicodone is a narcotic prescription.  Does have the addictive potential.  Do not drive or operate heavy machinery while taking this medicine  Your blood pressure was also significantly elevated here in the emergency department.  I recommend following up with your primary care provider to discuss medications as it looks like the last few visits to the emergency department or urgent care as they have been elevated as well

## 2023-02-09 NOTE — ED Triage Notes (Signed)
The patient is having right lower back pain that moves into her right leg. No injury.

## 2023-02-09 NOTE — ED Provider Notes (Signed)
[facility] Provider Note   CSN: [id number] Arrival date & time: 02/09/23  1432    History  Chief Complaint  Patient presents with   Back Pain    Ashley Rush is a 37 y.o. female with a past medical history here for evaluation of lower back pain.  Pain began yesterday.  Located to right lower back, goes into posterior buttocks and into right leg. Worse with movement.  She denies any known recent trauma or injury however does work with bending, twisting movements.  No fever, chest pain, shortness breath, abdominal pain, diarrhea, dysuria, hematuria, numbness, weakness.  Denies chance of pregnancy.  No IVDU, malignancy.  Does have known chronic swelling to right lower extremity from lymphedema.  She does have a history of prior provoked DVT as well.  Not currently anticoagulated.  She is on chronic swelling to her right lower extremity which she states at baseline.  No redness or warmth. Denies chance of pregnancy.  HPI     Home Medications Prior to Admission medications   Medication Sig Start Date End Date Taking? Authorizing Provider  lidocaine (LIDODERM) 5 % Place 1 patch onto the skin daily. Remove & Discard patch within 12 hours or as directed by MD 02/09/23  Yes Tavaughn Silguero A, PA-C  methocarbamol (ROBAXIN) 500 MG tablet Take 1 tablet (500 mg total) by mouth 2 (two) times daily. 02/09/23  Yes Oswin Johal A, PA-C  oxyCODONE (ROXICODONE) 5 MG immediate release tablet Take 1 tablet (5 mg total) by mouth every 4 (four) hours as needed for severe pain. 02/09/23  Yes Shastina Rua A, PA-C  predniSONE (DELTASONE) 50 MG tablet Take 1 tablet (50 mg total) by mouth daily for 5 days. 02/09/23 02/14/23 Yes Mauricia Mertens A, PA-C  lidocaine (XYLOCAINE) 2 % solution Use as directed 15 mLs in the mouth or throat every 6 (six) hours as needed for mouth pain. 09/10/22   Raspet, Noberto Retort, PA-C      Allergies    Morphine and codeine and  Shellfish allergy    Review of Systems   Review of Systems  Constitutional: Negative.   HENT: Negative.    Respiratory: Negative.    Cardiovascular: Negative.   Gastrointestinal: Negative.   Genitourinary: Negative.   Musculoskeletal:  Positive for back pain. Negative for arthralgias, joint swelling, myalgias, neck pain and neck stiffness.  Skin: Negative.   Neurological: Negative.   All other systems reviewed and are negative.   Physical Exam Updated Vital Signs BP (!) 183/112 (BP Location: Right Arm)   Pulse 66   Temp 98.5 F (36.9 C) (Oral)   Resp 18   Ht 5\' 7"  (1.702 m)   Wt 80 kg   SpO2 98%   BMI 27.62 kg/m  Physical Exam Vitals and nursing note reviewed.  Constitutional:      General: She is not in acute distress.    Appearance: She is well-developed. She is not ill-appearing, toxic-appearing or diaphoretic.  HENT:     Head: Atraumatic.  Eyes:     Pupils: Pupils are equal, round, and reactive to light.  Cardiovascular:     Rate and Rhythm: Normal rate.     Pulses:          Radial pulses are 2+ on the right side and 2+ on the left side.       Dorsalis pedis pulses are 2+ on the right side and 2+ on the left side.  Heart sounds: Normal heart sounds.  Pulmonary:     Effort: Pulmonary effort is normal. No respiratory distress.     Breath sounds: Normal breath sounds.  Abdominal:     General: Bowel sounds are normal. There is no distension.     Palpations: Abdomen is soft.     Tenderness: There is no abdominal tenderness. There is no guarding or rebound.  Musculoskeletal:        General: Normal range of motion.     Cervical back: Normal range of motion.     Comments: Posterior right lower back into right piriformis and sciatic region. Positive SLR on right at 35'. RLE non pitting edema to mid thigh. No redness, warmth. Non-tender calves. Compartments soft.  Skin:    General: Skin is warm and dry.     Capillary Refill: Capillary refill takes less than 2  seconds.     Comments: No erythema, warmth, rashes or lesions.  Neurological:     General: No focal deficit present.     Mental Status: She is alert.     Cranial Nerves: Cranial nerves 2-12 are intact.     Sensory: Sensation is intact.     Motor: Motor function is intact.     Gait: Gait is intact.  Psychiatric:        Mood and Affect: Mood normal.     ED Results / Procedures / Treatments   Labs (all labs ordered are listed, but only abnormal results are displayed) Labs Reviewed - No data to display  EKG None  Radiology US Venous Img Lower Unilateral Right  Result Date: 02/09/2023 CLINICAL DATA:  Right leg pain and swelling EXAM: RIGHT LOWER EXTREMITY VENOUS DOPPLER ULTRASOUND TECHNIQUE: Gray-scale sonography with compression, as well as color and duplex ultrasound, were performed to evaluate the deep venous system(s) from the level of the common femoral vein through the popliteal and proximal calf veins. COMPARISON:  None Available. FINDINGS: VENOUS Normal compressibility of the common femoral, superficial femoral, and popliteal veins, as well as the visualized calf veins. Visualized portions of profunda femoral vein and great saphenous vein unremarkable. No filling defects to suggest DVT on grayscale or color Doppler imaging. Doppler waveforms show normal direction of venous flow, normal respiratory plasticity and response to augmentation. Limited views of the contralateral common femoral vein are unremarkable. OTHER None. Limitations: none IMPRESSION: Negative. Electronically Signed   By: Helyn Numbers M.D.   On: 02/09/2023 17:57    Procedures Procedures    Medications Ordered in ED Medications  lidocaine (LIDODERM) 5 % 1 patch (1 patch Transdermal Patch Applied 02/09/23 1601)  ketorolac (TORADOL) 30 MG/ML injection 30 mg (30 mg Intramuscular Given 02/09/23 1601)  predniSONE (DELTASONE) tablet 60 mg (60 mg Oral Given 02/09/23 1600)  oxyCODONE-acetaminophen (PERCOCET/ROXICET) 5-325  MG per tablet 1 tablet (1 tablet Oral Given 02/09/23 1600)    ED Course/ Medical Decision Making/ A&P   37 year old here for evaluation of right lower back pain goes into right lower extremity.  She is afebrile, nonseptic, no paresthesia, history of IVDU, malignancy.  Does have known chronic swelling to her right lower extremity from lymphedema has had prior history of provoked DVT.  No redness or warmth.  No recent injury or trauma that she knows of however does do bending twisting movements at work.  Pain is reproducible on exam.  Positive straight leg raise on the right.  Shared decision making for patient for ultrasound given her history.  Patient would like ultrasound to rule  out DVT.  She is afebrile, nonseptic, not ill-appearing meantime we will treat her pain and reassess  Labs personally viewed and interpreted:  Ultrasound shows no DVT  Patient reassessed.  Pain improved.  Patient ambulatory.  Will treat symptomatically.  Will have her follow-up outpatient.   Patient also noted to have significantly elevated blood pressure here in the emergency department.  Reviewed her prior notes.  Blood pressures today similar to prior visits.  No shortness of breath, CP, abdominal pain, numbness or weakness.  I have low suspicion for hypertensive urgency or emergency.  I encouraged her to follow-up with her primary care provider for medication management.  Low suspicion for cauda equina, discitis, osteomyelitis, fx, dislocation, compartment syndrome, AAA, dissection, abdominal process such as UTI, stone, perforation, pregnancy complications (ectopic), PID, torsion, TOA.  The patient has been appropriately medically screened and/or stabilized in the ED. I have low suspicion for any other emergent medical condition which would require further screening, evaluation or treatment in the ED or require inpatient management.  Patient is hemodynamically stable and in no acute distress.  Patient able to ambulate  in department prior to ED.  Evaluation does not show acute pathology that would require ongoing or additional emergent interventions while in the emergency department or further inpatient treatment.  I have discussed the diagnosis with the patient and answered all questions.  Pain is been managed while in the emergency department and patient has no further complaints prior to discharge.  Patient is comfortable with plan discussed in room and is stable for discharge at this time.  I have discussed strict return precautions for returning to the emergency department.  Patient was encouraged to follow-up with PCP/specialist refer to at discharge.                                  Medical Decision Making Amount and/or Complexity of Data Reviewed External Data Reviewed: radiology and notes. Radiology: ordered and independent interpretation performed. Decision-making details documented in ED Course.  Risk OTC drugs. Prescription drug management. Parenteral controlled substances. Decision regarding hospitalization. Diagnosis or treatment significantly limited by social determinants of health.          Final Clinical Impression(s) / ED Diagnoses Final diagnoses:  Acute right-sided low back pain with right-sided sciatica  History of DVT (deep vein thrombosis)  Elevated blood pressure reading    Rx / DC Orders ED Discharge Orders          Ordered    predniSONE (DELTASONE) 50 MG tablet  Daily        02/09/23 1803    methocarbamol (ROBAXIN) 500 MG tablet  2 times daily        02/09/23 1803    lidocaine (LIDODERM) 5 %  Every 24 hours        02/09/23 1803    oxyCODONE (ROXICODONE) 5 MG immediate release tablet  Every 4 hours PRN        02/09/23 1803              Blossie Raffel A, PA-C 02/09/23 1809    Terrilee Files, MD 02/10/23 1008

## 2023-02-13 DIAGNOSIS — M5451 Vertebrogenic low back pain: Secondary | ICD-10-CM | POA: Diagnosis not present

## 2023-02-20 ENCOUNTER — Ambulatory Visit: Payer: Commercial Managed Care - PPO | Admitting: Physical Therapy

## 2023-02-25 ENCOUNTER — Ambulatory Visit: Payer: Commercial Managed Care - PPO

## 2023-02-26 ENCOUNTER — Ambulatory Visit: Payer: Commercial Managed Care - PPO | Attending: Orthopedic Surgery

## 2023-02-26 ENCOUNTER — Other Ambulatory Visit: Payer: Self-pay

## 2023-02-26 DIAGNOSIS — R252 Cramp and spasm: Secondary | ICD-10-CM | POA: Insufficient documentation

## 2023-02-26 DIAGNOSIS — M5459 Other low back pain: Secondary | ICD-10-CM | POA: Insufficient documentation

## 2023-02-26 NOTE — Therapy (Signed)
OUTPATIENT PHYSICAL THERAPY THORACOLUMBAR EVALUATION   Patient Name: Ashley Rush MRN: [id number] DOB:April 20, 1986, 37 y.o., female Today's Date: 02/26/2023  END OF SESSION:  PT End of Session - 02/26/23 1705     Visit Number 1    Date for PT Re-Evaluation 04/23/23    Authorization Type [facility] [employer], Medicaid [place] (submitted for auth)    PT Start Time 1621    PT Stop Time 1658    PT Time Calculation (min) 37 min    Activity Tolerance Patient tolerated treatment well    Behavior During Therapy WFL for tasks assessed/performed             Past Medical History:  Diagnosis Date   Coronavirus infection    DVT (deep venous thrombosis) (HCC)    Gestational diabetes    Hypertension    Miscarriage    Past Surgical History:  Procedure Laterality Date   CESAREAN SECTION  08/24/07   CESAREAN SECTION N/A 04/23/2019   Procedure: CESAREAN SECTION;  Surgeon: Ashley Rein, MD;  Location: [facility];  Service: Obstetrics;  Laterality: N/A;   Patient Active Problem List   Diagnosis Date Noted   COVID-19 affecting pregnancy in third trimester 04/25/2019   Maternal care due to low transverse uterine scar from previous cesarean delivery 04/23/2019   Status post repeat low transverse cesarean section 04/23/2019   Abnormal human chorionic gonadotropin (hCG) 05/13/2017   Pregnancy of unknown anatomic location 05/13/2017   Edema 04/23/2013   Lymphedema 04/23/2013   Spotting between menses 12/21/2010   Acne 09/07/2010    PCP: Ashley Rush  REFERRING PROVIDER: Venita Lick, MD  REFERRING DIAG: M54.51 (ICD-10-CM) - Vertebrogenic low back pain  Rationale for Evaluation and Treatment: Rehabilitation  THERAPY DIAG:  Other low back pain - Plan: PT plan of care cert/re-cert  Cramp and spasm - Plan: PT plan of care cert/re-cert  ONSET DATE: 02/08/2023  SUBJECTIVE:                                                                                                                                                                                            SUBJECTIVE STATEMENT: Pt presents to PT with LBP that began 02/08/23. Pt woke up with the pain without incident or injury.  Pt went to ED at that time and then she saw Ashley Rush and he gave her a back brace for support at work.  MD did x-ray and pt reports that she has inflammation in her spine.  PERTINENT HISTORY:  Rt LE chronic edema  PAIN: 02/26/23 Are you having pain? Yes: NPRS scale: 6-7/10 Pain location: Rt low back  into Rt glute Pain description: throbbing, constant  Aggravating factors: siting (10 min) Relieving factors: standing up, Naproxen  PRECAUTIONS: None  RED FLAGS: None   WEIGHT BEARING RESTRICTIONS: Rush  FALLS:  Has patient fallen in last 6 months? 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  DIAGNOSTIC FINDINGS:  X-ray at MD: negative per pt report   PATIENT SURVEYS:  Modified Oswestry 26% disability 13/50    COGNITION: Overall cognitive status: Within functional limits for tasks assessed     SENSATION: WFL    POSTURE: Rush Significant postural limitations  PALPATION: Palpable tenderness and trigger points over Rt gluteals and quadratus/paraspinals.  Normal segmental mobility with pain with PA mobs L3-5  LUMBAR ROM:  Full with Lt lumbar pain with Lt side bending   LOWER EXTREMITY ROM:    Full without pain Edema in Rt LE that is baseline for this patient  LOWER EXTREMITY MMT:    Bil hips 4+/5, knees 5/5, ankles 5/5.  Rt lumbar pain with resisted LE testing bilaterally   GAIT: Distance walked: 100 Assistive device utilized:  None Level of assistance: Complete Independence Comments: Rush gait deviation   TODAY'S TREATMENT:                                                                                                                              DATE: 02/26/23 HEP established- see below  If treatment provided at initial evaluation, Rush treatment charged due to lack of authorization.     PATIENT EDUCATION:  Education details: Access Code: [email or web address], DN info (handout provided) Person educated: Patient Education method: Explanation, Demonstration, and Handouts Education comprehension: verbalized understanding and returned demonstration  HOME EXERCISE PROGRAM: Access Code: [email or web address] URL: [email or web address] Date: 02/26/2023 Prepared by: Ashley Rush  Exercises - Child's Pose Stretch  - 3 x daily - 7 x weekly - 1 sets - 3 reps - 20 hold - Child's Pose with Sidebending  - 3 x daily - 7 x weekly - 1 sets - 3 reps - 20 hold - Supine Lower Trunk Rotation  - 3 x daily - 7 x weekly - 1 sets - 3 reps - 20 hold - Seated Piriformis Stretch with Trunk Bend  - 3 x daily - 7 x weekly - 1 sets - 3 reps - 20 hold - Seated Hamstring Stretch  - 3 x daily - 7 x weekly - 1 sets - 3 reps - 20 hold  ASSESSMENT:  CLINICAL IMPRESSION: Patient is a 37 y.o. female who was seen today for physical therapy evaluation and treatment for LBP. Pain began ~3 weeks ago without cause.  Pt had x-ray that was negative and MD issued a soft  back brace for wear at work.  Pt reports 6-7/10 Rt sided lumbar and gluteal pain that is worse with sitting ~10 min.  ODI is 26% perceived disability.  Palpable tenderness over Rt quadratus, paraspinals and gluteals with trigger points.  Patient will benefit from skilled PT to address the below impairments and improve overall function.   OBJECTIVE IMPAIRMENTS: decreased activity tolerance, increased muscle spasms, improper body mechanics, and pain.   ACTIVITY LIMITATIONS: sitting  PARTICIPATION  LIMITATIONS: driving  PERSONAL FACTORS: 1 comorbidity: none   are also affecting patient's functional outcome.   REHAB POTENTIAL: Good  CLINICAL DECISION MAKING: Stable/uncomplicated  EVALUATION COMPLEXITY: Low   GOALS: Goals reviewed with patient? Yes  SHORT TERM GOALS: Target date: 03/26/2023    Be independent in initial HEP Baseline: Goal status: INITIAL  2.  Report > or = to 30% reduction in LBP with sitting  Baseline: 6-7/10 Goal status: INITIAL  3.  Sit for > or = to 20 min before LBP begins  Baseline: 10 min max  Goal status: INITIAL    LONG TERM GOALS: Target date: 04/23/2023    Be independent in advanced HEP Baseline:  Goal status: INITIAL  2.  Improve ODI to < or = to 10% disability  Baseline: 26%, 13/50 Goal status: INITIAL  3.  Sit for 30-45 minutes without limitation due to LBP  Baseline: 10 min  Goal status: INITIAL  4.  Report > or = to 70% reduction in LBP with sitting  Baseline:  Goal status: INITIAL  5.  Verbalize and demonstrate body mechanics for lumbar protection  Baseline:  Goal status: INITIAL   PLAN:  PT FREQUENCY: 2x/week  PT DURATION: 8 weeks  PLANNED INTERVENTIONS: 97110-Therapeutic exercises, 97530- Therapeutic activity, [id number]- Neuromuscular re-education, 97535- Self Care, 78469- Manual therapy, [id number]- Gait training, [phone]- Canalith repositioning, [id number]- Aquatic Therapy, 97760- Splinting, 97014- Electrical stimulation (unattended), [id number]- Electrical stimulation (manual), [id number]- Ultrasound, [id number]- Traction (mechanical), [id number]- Ionotophoresis 4mg /ml Dexamethasone, Patient/Family education, Balance training, Stair training, Taping, Dry Needling, Joint mobilization, Vestibular training, Visual/preceptual remediation/compensation, DME instructions, Cryotherapy, and Moist heat.  PLAN FOR NEXT SESSION: Review HEP, DN to Rt QL and lumbar multifidi/gluteals, begin core strengthening, body mechanics    Lorrene Reid, PT 02/26/23 5:12  PM   [facility] [address] [place], [place] [address] Phone # [phone] Fax [phone]

## 2023-02-26 NOTE — Patient Instructions (Signed)

## 2023-03-04 DIAGNOSIS — Z30431 Encounter for routine checking of intrauterine contraceptive device: Secondary | ICD-10-CM | POA: Diagnosis not present

## 2023-03-04 DIAGNOSIS — N898 Other specified noninflammatory disorders of vagina: Secondary | ICD-10-CM | POA: Diagnosis not present

## 2023-03-04 DIAGNOSIS — N760 Acute vaginitis: Secondary | ICD-10-CM | POA: Diagnosis not present

## 2023-03-04 DIAGNOSIS — B3731 Acute candidiasis of vulva and vagina: Secondary | ICD-10-CM | POA: Diagnosis not present

## 2023-03-12 ENCOUNTER — Telehealth: Payer: Self-pay

## 2023-03-12 ENCOUNTER — Ambulatory Visit: Payer: Commercial Managed Care - PPO | Attending: Orthopedic Surgery

## 2023-03-12 DIAGNOSIS — M5459 Other low back pain: Secondary | ICD-10-CM | POA: Insufficient documentation

## 2023-03-12 DIAGNOSIS — R252 Cramp and spasm: Secondary | ICD-10-CM | POA: Insufficient documentation

## 2023-03-12 NOTE — Telephone Encounter (Signed)
PT called and left voicemail due to no-show appt.

## 2023-03-17 ENCOUNTER — Ambulatory Visit: Payer: Commercial Managed Care - PPO

## 2023-03-17 ENCOUNTER — Telehealth: Payer: Self-pay

## 2023-03-17 NOTE — Telephone Encounter (Signed)
Left voicemail.  2nd consecutive no-show appt.  PT will send pt a MyChart message outlining our attendance policy and notifying that we will remove remaining appts and pt can call back to schedule appts one at a time moving forward.

## 2023-03-18 ENCOUNTER — Ambulatory Visit
Admission: EM | Admit: 2023-03-18 | Discharge: 2023-03-18 | Disposition: A | Discharge status: Home / Self Care | Payer: Commercial Managed Care - PPO | Attending: Family Medicine | Admitting: Family Medicine

## 2023-03-18 DIAGNOSIS — J020 Streptococcal pharyngitis: Secondary | ICD-10-CM

## 2023-03-18 DIAGNOSIS — J029 Acute pharyngitis, unspecified: Secondary | ICD-10-CM | POA: Diagnosis not present

## 2023-03-18 LAB — POCT RAPID STREP A (OFFICE): Rapid Strep A Screen: POSITIVE — AB

## 2023-03-18 MED ORDER — PREDNISONE 20 MG PO TABS: ORAL_TABLET | ORAL | 0 refills | Status: DC

## 2023-03-18 MED ORDER — AMOXICILLIN 875 MG PO TABS: 875.0000 mg | ORAL_TABLET | Freq: Two times a day (BID) | ORAL | 0 refills | Status: DC

## 2023-03-18 NOTE — Discharge Instructions (Addendum)
Instructed patient to take medication as directed with food to completion.  Advised patient to take prednisone with first dose of amoxicillin for the next 5 of 7 days.  Encouraged to increase daily water intake to 64 ounces per day while taking these medications.  Advised if symptoms worsen and/or unresolved please follow-up with PCP or here for further evaluation.

## 2023-03-18 NOTE — ED Triage Notes (Signed)
Pt presents to [facility] with co of sore throat since 3 days ago pt is concerned for strep

## 2023-03-18 NOTE — ED Provider Notes (Signed)
[facility]    CSN: [id number] Arrival date & time: 03/18/23  1904      History   Chief Complaint Chief Complaint  Patient presents with   Sore Throat    HPI Ashley Rush is a 37 y.o. female.   HPI 37 year old female presents with sore throat for days.  PMH significant for HTN, DVT, and obesity.  Past Medical History:  Diagnosis Date   Coronavirus infection    DVT (deep venous thrombosis) (HCC)    Gestational diabetes    Hypertension    Miscarriage     Patient Active Problem List   Diagnosis Date Noted   COVID-19 affecting pregnancy in third trimester 04/25/2019   Maternal care due to low transverse uterine scar from previous cesarean delivery 04/23/2019   Status post repeat low transverse cesarean section 04/23/2019   Abnormal human chorionic gonadotropin (hCG) 05/13/2017   Pregnancy of unknown anatomic location 05/13/2017   Edema 04/23/2013   Lymphedema 04/23/2013   Spotting between menses 12/21/2010   Acne 09/07/2010    Past Surgical History:  Procedure Laterality Date   CESAREAN SECTION  08/24/07   CESAREAN SECTION N/A 04/23/2019   Procedure: CESAREAN SECTION;  Surgeon: Sherian Rein, MD;  Location: [facility] LD [facility];  Service: Obstetrics;  Laterality: N/A;    OB History     Gravida  5   Para  2   Term  2   Preterm      AB  3   Living  2      SAB  1   IAB  2   Ectopic      Multiple  0   Live Births  2            Home Medications    Prior to Admission medications   Medication Sig Start Date End Date Taking? Authorizing Provider  amoxicillin (AMOXIL) 875 MG tablet Take 1 tablet (875 mg total) by mouth 2 (two) times daily for 7 days. 03/18/23 03/25/23 Yes Trevor Iha, FNP  predniSONE (DELTASONE) 20 MG tablet Take 3 tabs PO daily x 5 days. 03/18/23  Yes Trevor Iha, FNP  lidocaine (LIDODERM) 5 % Place 1 patch onto the skin daily. Remove & Discard patch within 12 hours or as directed by MD 02/09/23    Henderly, Britni A, PA-C  lidocaine (XYLOCAINE) 2 % solution Use as directed 15 mLs in the mouth or throat every 6 (six) hours as needed for mouth pain. 09/10/22   Raspet, Noberto Retort, PA-C    Family History Family History  Problem Relation Age of Onset   Diabetes Mother    Heart disease Mother    Immunodeficiency Mother        HIV- deceased at age 74   Hypertension Mother    Diabetes Father    Heart disease Father    Immunodeficiency Father        HIV- died last year at 83   Hypertension Father    Varicose Veins Father    Heart attack Father     Social History Social History   Tobacco Use   Smoking status: Never   Smokeless tobacco: Never  Vaping Use   Vaping status: Never Used  Substance Use Topics   Alcohol use: Yes    Alcohol/week: 3.0 standard drinks of alcohol    Types: 3 Shots of liquor per week   Drug use: No     Allergies   Morphine and codeine and Shellfish allergy  Review of Systems Review of Systems  HENT:  Positive for sore throat.      Physical Exam Triage Vital Signs ED Triage Vitals  Encounter Vitals Group     BP      Systolic BP Percentile      Diastolic BP Percentile      Pulse      Resp      Temp      Temp src      SpO2      Weight      Height      Head Circumference      Peak Flow      Pain Score      Pain Loc      Pain Education      Exclude from Growth Chart    No data found.  Updated Vital Signs BP (!) 151/108   Pulse 89   Temp 98.5 F (36.9 C)   Resp 16   SpO2 98%    Physical Exam Vitals and nursing note reviewed.  Constitutional:      Appearance: Normal appearance. She is obese.  HENT:     Head: Normocephalic and atraumatic.     Mouth/Throat:     Mouth: Mucous membranes are moist.     Pharynx: Oropharynx is clear. Uvula midline. Posterior oropharyngeal erythema and uvula swelling present.     Tonsils: 4+ on the right. 4+ on the left.  Eyes:     Extraocular Movements: Extraocular movements intact.      Conjunctiva/sclera: Conjunctivae normal.     Pupils: Pupils are equal, round, and reactive to light.  Cardiovascular:     Rate and Rhythm: Normal rate and regular rhythm.     Pulses: Normal pulses.     Heart sounds: Normal heart sounds.  Pulmonary:     Effort: Pulmonary effort is normal.     Breath sounds: Normal breath sounds. No wheezing, rhonchi or rales.  Musculoskeletal:        General: Normal range of motion.     Cervical back: Normal range of motion and neck supple.  Skin:    General: Skin is warm and dry.  Neurological:     General: No focal deficit present.     Mental Status: She is alert and oriented to person, place, and time. Mental status is at baseline.  Psychiatric:        Mood and Affect: Mood normal.        Behavior: Behavior normal.      UC Treatments / Results  Labs (all labs ordered are listed, but only abnormal results are displayed) Labs Reviewed  POCT RAPID STREP A (OFFICE) - Abnormal; Notable for the following components:      Result Value   Rapid Strep A Screen Positive (*)    All other components within normal limits    EKG   Radiology No results found.  Procedures Procedures (including critical care time)  Medications Ordered in UC Medications - No data to display  Initial Impression / Assessment and Plan / UC Course  I have reviewed the triage vital signs and the nursing notes.  Pertinent labs & imaging results that were available during my care of the patient were reviewed by me and considered in my medical decision making (see chart for details).     MDM: 1.  Strep pharyngitis-Rx'd amoxicillin 875 mg tablet: Take 1 tablet twice daily x 7 days; 2.  Sore throat-Rx'd prednisone 20 mg tablet: Take 3  tabs p.o. daily x 5 days. Instructed patient to take medication as directed with food to completion.  Advised patient to take prednisone with first dose of amoxicillin for the next 5 of 7 days.  Encouraged to increase daily water intake to 64  ounces per day while taking these medications.  Advised if symptoms worsen and/or unresolved please follow-up with PCP or here for further evaluation.  Patient discharged home, hemodynamically stable. Final Clinical Impressions(s) / UC Diagnoses   Final diagnoses:  Strep pharyngitis  Sore throat     Discharge Instructions      Instructed patient to take medication as directed with food to completion.  Advised patient to take prednisone with first dose of amoxicillin for the next 5 of 7 days.  Encouraged to increase daily water intake to 64 ounces per day while taking these medications.  Advised if symptoms worsen and/or unresolved please follow-up with PCP or here for further evaluation.     ED Prescriptions     Medication Sig Dispense Auth. Provider   amoxicillin (AMOXIL) 875 MG tablet Take 1 tablet (875 mg total) by mouth 2 (two) times daily for 7 days. 14 tablet Trevor Iha, FNP   predniSONE (DELTASONE) 20 MG tablet Take 3 tabs PO daily x 5 days. 15 tablet Trevor Iha, FNP      PDMP not reviewed this encounter.   Trevor Iha, FNP 03/18/23 1949

## 2023-03-19 ENCOUNTER — Ambulatory Visit: Payer: Commercial Managed Care - PPO

## 2023-03-19 ENCOUNTER — Other Ambulatory Visit (HOSPITAL_COMMUNITY): Payer: Self-pay

## 2023-03-19 MED ORDER — PREDNISONE 20 MG PO TABS
60.0000 mg | ORAL_TABLET | Freq: Every day | ORAL | 0 refills | Status: DC
  Filled 2023-03-19: qty 15, 5d supply, fill #0

## 2023-03-19 MED ORDER — AMOXICILLIN 875 MG PO TABS
875.0000 mg | ORAL_TABLET | Freq: Two times a day (BID) | ORAL | 0 refills | Status: DC
  Filled 2023-03-19: qty 14, 7d supply, fill #0

## 2023-03-19 MED ORDER — FLUCONAZOLE 150 MG PO TABS
150.0000 mg | ORAL_TABLET | Freq: Every day | ORAL | 0 refills | Status: DC
  Filled 2023-03-19: qty 2, 2d supply, fill #0

## 2023-03-25 ENCOUNTER — Inpatient Hospital Stay (HOSPITAL_COMMUNITY)
Admission: EM | Admit: 2023-03-25 | Discharge: 2023-03-27 | DRG: 084 | Disposition: A | Discharge status: Home / Self Care | Payer: Commercial Managed Care - PPO | Attending: General Surgery | Admitting: General Surgery

## 2023-03-25 ENCOUNTER — Other Ambulatory Visit: Payer: Self-pay

## 2023-03-25 ENCOUNTER — Emergency Department (HOSPITAL_COMMUNITY): Payer: Commercial Managed Care - PPO

## 2023-03-25 ENCOUNTER — Encounter (HOSPITAL_COMMUNITY): Payer: Self-pay

## 2023-03-25 ENCOUNTER — Inpatient Hospital Stay (HOSPITAL_COMMUNITY): Payer: Commercial Managed Care - PPO

## 2023-03-25 DIAGNOSIS — S065X9A Traumatic subdural hemorrhage with loss of consciousness of unspecified duration, initial encounter: Principal | ICD-10-CM | POA: Diagnosis present

## 2023-03-25 DIAGNOSIS — Z91013 Allergy to seafood: Secondary | ICD-10-CM

## 2023-03-25 DIAGNOSIS — Y92488 Other paved roadways as the place of occurrence of the external cause: Secondary | ICD-10-CM

## 2023-03-25 DIAGNOSIS — M79672 Pain in left foot: Secondary | ICD-10-CM | POA: Diagnosis present

## 2023-03-25 DIAGNOSIS — S065XAA Traumatic subdural hemorrhage with loss of consciousness status unknown, initial encounter: Principal | ICD-10-CM | POA: Diagnosis present

## 2023-03-25 DIAGNOSIS — S3993XA Unspecified injury of pelvis, initial encounter: Secondary | ICD-10-CM | POA: Diagnosis not present

## 2023-03-25 DIAGNOSIS — M25572 Pain in left ankle and joints of left foot: Secondary | ICD-10-CM | POA: Diagnosis not present

## 2023-03-25 DIAGNOSIS — I10 Essential (primary) hypertension: Secondary | ICD-10-CM | POA: Diagnosis present

## 2023-03-25 DIAGNOSIS — S065X0A Traumatic subdural hemorrhage without loss of consciousness, initial encounter: Secondary | ICD-10-CM | POA: Diagnosis not present

## 2023-03-25 DIAGNOSIS — R69 Illness, unspecified: Secondary | ICD-10-CM | POA: Diagnosis not present

## 2023-03-25 DIAGNOSIS — Z885 Allergy status to narcotic agent status: Secondary | ICD-10-CM | POA: Diagnosis not present

## 2023-03-25 DIAGNOSIS — M79641 Pain in right hand: Secondary | ICD-10-CM | POA: Diagnosis not present

## 2023-03-25 DIAGNOSIS — Z23 Encounter for immunization: Secondary | ICD-10-CM | POA: Diagnosis not present

## 2023-03-25 DIAGNOSIS — Z8616 Personal history of COVID-19: Secondary | ICD-10-CM

## 2023-03-25 DIAGNOSIS — M79644 Pain in right finger(s): Secondary | ICD-10-CM | POA: Diagnosis present

## 2023-03-25 DIAGNOSIS — S90454A Superficial foreign body, right lesser toe(s), initial encounter: Secondary | ICD-10-CM | POA: Diagnosis present

## 2023-03-25 DIAGNOSIS — S91012A Laceration without foreign body, left ankle, initial encounter: Secondary | ICD-10-CM | POA: Diagnosis not present

## 2023-03-25 DIAGNOSIS — S61412A Laceration without foreign body of left hand, initial encounter: Secondary | ICD-10-CM | POA: Diagnosis not present

## 2023-03-25 DIAGNOSIS — S90512A Abrasion, left ankle, initial encounter: Secondary | ICD-10-CM | POA: Diagnosis not present

## 2023-03-25 DIAGNOSIS — Z8249 Family history of ischemic heart disease and other diseases of the circulatory system: Secondary | ICD-10-CM

## 2023-03-25 DIAGNOSIS — S0003XA Contusion of scalp, initial encounter: Secondary | ICD-10-CM | POA: Diagnosis not present

## 2023-03-25 DIAGNOSIS — I6200 Nontraumatic subdural hemorrhage, unspecified: Secondary | ICD-10-CM | POA: Diagnosis not present

## 2023-03-25 DIAGNOSIS — S299XXA Unspecified injury of thorax, initial encounter: Secondary | ICD-10-CM | POA: Diagnosis not present

## 2023-03-25 DIAGNOSIS — Z833 Family history of diabetes mellitus: Secondary | ICD-10-CM | POA: Diagnosis not present

## 2023-03-25 DIAGNOSIS — Z86718 Personal history of other venous thrombosis and embolism: Secondary | ICD-10-CM

## 2023-03-25 DIAGNOSIS — S3991XA Unspecified injury of abdomen, initial encounter: Secondary | ICD-10-CM | POA: Diagnosis not present

## 2023-03-25 DIAGNOSIS — M7989 Other specified soft tissue disorders: Secondary | ICD-10-CM | POA: Diagnosis not present

## 2023-03-25 DIAGNOSIS — R4182 Altered mental status, unspecified: Secondary | ICD-10-CM | POA: Diagnosis not present

## 2023-03-25 LAB — URINALYSIS, ROUTINE W REFLEX MICROSCOPIC
Bilirubin Urine: NEGATIVE
Glucose, UA: NEGATIVE mg/dL
Hgb urine dipstick: NEGATIVE
Ketones, ur: NEGATIVE mg/dL
Leukocytes,Ua: NEGATIVE
Nitrite: NEGATIVE
Protein, ur: NEGATIVE mg/dL
Specific Gravity, Urine: 1.009 (ref 1.005–1.030)
pH: 7 (ref 5.0–8.0)

## 2023-03-25 LAB — COMPREHENSIVE METABOLIC PANEL
ALT: 35 U/L (ref 0–44)
AST: 36 U/L (ref 15–41)
Albumin: 3.9 g/dL (ref 3.5–5.0)
Alkaline Phosphatase: 46 U/L (ref 38–126)
Anion gap: 6 (ref 5–15)
BUN: 16 mg/dL (ref 6–20)
CO2: 26 mmol/L (ref 22–32)
Calcium: 9 mg/dL (ref 8.9–10.3)
Chloride: 104 mmol/L (ref 98–111)
Creatinine, Ser: 1.07 mg/dL — ABNORMAL HIGH (ref 0.44–1.00)
GFR, Estimated: >60 mL/min (ref >60)
Glucose, Bld: 98 mg/dL (ref 70–99)
Potassium: 3.8 mmol/L (ref 3.5–5.1)
Sodium: 136 mmol/L (ref 135–145)
Total Bilirubin: 0.9 mg/dL (ref <1.2)
Total Protein: 7 g/dL (ref 6.5–8.1)

## 2023-03-25 LAB — CBC
HCT: 42.5 % (ref 36.0–46.0)
Hemoglobin: 14.1 g/dL (ref 12.0–15.0)
MCH: 29.9 pg (ref 26.0–34.0)
MCHC: 33.2 g/dL (ref 30.0–36.0)
MCV: 90 fL (ref 80.0–100.0)
Platelets: 262 10*3/uL (ref 150–400)
RBC: 4.72 MIL/uL (ref 3.87–5.11)
RDW: 11.9 % (ref 11.5–15.5)
WBC: 8.4 10*3/uL (ref 4.0–10.5)
nRBC: 0 % (ref 0.0–0.2)

## 2023-03-25 LAB — SAMPLE TO BLOOD BANK

## 2023-03-25 LAB — HIV ANTIBODY (ROUTINE TESTING W REFLEX): HIV Screen 4th Generation wRfx: NONREACTIVE

## 2023-03-25 LAB — MRSA NEXT GEN BY PCR, NASAL: MRSA by PCR Next Gen: NOT DETECTED

## 2023-03-25 LAB — HCG, SERUM, QUALITATIVE: Preg, Serum: NEGATIVE

## 2023-03-25 LAB — LIPASE, BLOOD: Lipase: 24 U/L (ref 11–51)

## 2023-03-25 MED ORDER — METHOCARBAMOL 1000 MG/10ML IJ SOLN: 500.0000 mg | Freq: Three times a day (TID) | INTRAMUSCULAR | Status: DC

## 2023-03-25 MED ORDER — POLYETHYLENE GLYCOL 3350 17 G PO PACK
17.0000 g | PACK | Freq: Every day | ORAL | Status: DC | PRN
Start: 2023-03-25 — End: 2023-03-27

## 2023-03-25 MED ORDER — CLEVIDIPINE BUTYRATE 0.5 MG/ML IV EMUL
0.0000 mg/h | INTRAVENOUS | Status: DC
  Administered 2023-03-25: 2 mg/h via INTRAVENOUS
  Filled 2023-03-25: qty 100

## 2023-03-25 MED ORDER — ACETAMINOPHEN 500 MG PO TABS
1000.0000 mg | ORAL_TABLET | Freq: Once | ORAL | Status: AC
  Administered 2023-03-25: 1000 mg via ORAL
  Filled 2023-03-25: qty 2

## 2023-03-25 MED ORDER — OXYCODONE HCL 5 MG PO TABS
10.0000 mg | ORAL_TABLET | ORAL | Status: DC | PRN
  Administered 2023-03-25 – 2023-03-27 (×6): 10 mg via ORAL
  Filled 2023-03-25 (×6): qty 2

## 2023-03-25 MED ORDER — HYDRALAZINE HCL 20 MG/ML IJ SOLN
10.0000 mg | INTRAMUSCULAR | Status: DC | PRN
  Filled 2023-03-25: qty 1

## 2023-03-25 MED ORDER — DOCUSATE SODIUM 100 MG PO CAPS
100.0000 mg | ORAL_CAPSULE | Freq: Two times a day (BID) | ORAL | Status: DC
  Administered 2023-03-25 – 2023-03-27 (×4): 100 mg via ORAL
  Filled 2023-03-25 (×4): qty 1

## 2023-03-25 MED ORDER — LEVETIRACETAM IN NACL 1500 MG/100ML IV SOLN: 1500.0000 mg | Freq: Once | INTRAVENOUS | Status: DC

## 2023-03-25 MED ORDER — METOPROLOL TARTRATE 5 MG/5ML IV SOLN: 5.0000 mg | Freq: Four times a day (QID) | INTRAVENOUS | Status: DC | PRN

## 2023-03-25 MED ORDER — METHOCARBAMOL 500 MG PO TABS
500.0000 mg | ORAL_TABLET | Freq: Three times a day (TID) | ORAL | Status: DC
Start: 2023-03-25 — End: 2023-03-28
  Administered 2023-03-25 – 2023-03-27 (×5): 500 mg via ORAL
  Filled 2023-03-25 (×5): qty 1

## 2023-03-25 MED ORDER — ONDANSETRON HCL 4 MG/2ML IJ SOLN: 4.0000 mg | Freq: Four times a day (QID) | INTRAMUSCULAR | Status: DC | PRN

## 2023-03-25 MED ORDER — TETANUS-DIPHTH-ACELL PERTUSSIS 5-2.5-18.5 LF-MCG/0.5 IM SUSY
0.5000 mL | PREFILLED_SYRINGE | Freq: Once | INTRAMUSCULAR | Status: AC
Start: 2023-03-25 — End: 2023-03-25
  Administered 2023-03-25: 0.5 mL via INTRAMUSCULAR
  Filled 2023-03-25: qty 0.5

## 2023-03-25 MED ORDER — OXYCODONE HCL 5 MG PO TABS
5.0000 mg | ORAL_TABLET | ORAL | Status: DC | PRN
  Administered 2023-03-26: 5 mg via ORAL
  Filled 2023-03-25: qty 1

## 2023-03-25 MED ORDER — HYDROMORPHONE HCL 1 MG/ML IJ SOLN
0.5000 mg | Freq: Once | INTRAMUSCULAR | Status: AC
  Administered 2023-03-25: 0.5 mg via INTRAVENOUS

## 2023-03-25 MED ORDER — MUPIROCIN 2 % EX OINT: 1.0000 | TOPICAL_OINTMENT | Freq: Two times a day (BID) | CUTANEOUS | Status: DC

## 2023-03-25 MED ORDER — LEVETIRACETAM IN NACL 1000 MG/100ML IV SOLN
1000.0000 mg | Freq: Once | INTRAVENOUS | Status: AC
  Administered 2023-03-25: 1000 mg via INTRAVENOUS
  Filled 2023-03-25: qty 100

## 2023-03-25 MED ORDER — ORAL CARE MOUTH RINSE: 15.0000 mL | OROMUCOSAL | Status: DC | PRN

## 2023-03-25 MED ORDER — HYDRALAZINE HCL 20 MG/ML IJ SOLN
10.0000 mg | INTRAMUSCULAR | Status: DC | PRN
  Administered 2023-03-26 – 2023-03-27 (×2): 10 mg via INTRAVENOUS
  Filled 2023-03-25 (×2): qty 1

## 2023-03-25 MED ORDER — HYDROMORPHONE HCL 1 MG/ML IJ SOLN
0.5000 mg | INTRAMUSCULAR | Status: DC | PRN
  Administered 2023-03-25 – 2023-03-26 (×2): 0.5 mg via INTRAVENOUS
  Filled 2023-03-25 (×2): qty 1

## 2023-03-25 MED ORDER — CYCLOBENZAPRINE HCL 10 MG PO TABS
10.0000 mg | ORAL_TABLET | Freq: Once | ORAL | Status: AC
  Administered 2023-03-25: 10 mg via ORAL
  Filled 2023-03-25: qty 1

## 2023-03-25 MED ORDER — SODIUM CHLORIDE 0.9 % IV SOLN
INTRAVENOUS | Status: DC
Start: 2023-03-25 — End: 2023-03-25

## 2023-03-25 MED ORDER — ACETAMINOPHEN 500 MG PO TABS
1000.0000 mg | ORAL_TABLET | Freq: Four times a day (QID) | ORAL | Status: DC
  Administered 2023-03-25 – 2023-03-27 (×8): 1000 mg via ORAL
  Filled 2023-03-25 (×8): qty 2

## 2023-03-25 MED ORDER — LABETALOL HCL 5 MG/ML IV SOLN
20.0000 mg | Freq: Once | INTRAVENOUS | Status: AC
  Administered 2023-03-25: 20 mg via INTRAVENOUS
  Filled 2023-03-25: qty 4

## 2023-03-25 MED ORDER — LEVETIRACETAM IN NACL 500 MG/100ML IV SOLN
500.0000 mg | Freq: Two times a day (BID) | INTRAVENOUS | Status: DC
  Administered 2023-03-25 – 2023-03-26 (×2): 500 mg via INTRAVENOUS
  Filled 2023-03-25 (×2): qty 100

## 2023-03-25 MED ORDER — CHLORHEXIDINE GLUCONATE CLOTH 2 % EX PADS
6.0000 | MEDICATED_PAD | Freq: Every day | CUTANEOUS | Status: DC
Start: 2023-03-26 — End: 2023-03-31
  Administered 2023-03-26: 6 via TOPICAL

## 2023-03-25 MED ORDER — ONDANSETRON 4 MG PO TBDP: 4.0000 mg | ORAL_TABLET | Freq: Four times a day (QID) | ORAL | Status: DC | PRN

## 2023-03-25 MED ORDER — LEVETIRACETAM IN NACL 500 MG/100ML IV SOLN
500.0000 mg | Freq: Once | INTRAVENOUS | Status: AC
  Administered 2023-03-25: 500 mg via INTRAVENOUS
  Filled 2023-03-25: qty 100

## 2023-03-25 MED ORDER — IOHEXOL 350 MG/ML SOLN
75.0000 mL | Freq: Once | INTRAVENOUS | Status: AC | PRN
  Administered 2023-03-25: 75 mL via INTRAVENOUS

## 2023-03-25 MED ORDER — HYDROMORPHONE HCL 1 MG/ML IJ SOLN
INTRAMUSCULAR | Status: AC
  Filled 2023-03-25: qty 1
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
## 2023-03-25 NOTE — ED Notes (Signed)
Patient ambulated to the restroom with assistance.
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
## 2023-03-25 NOTE — ED Notes (Signed)
Patient transported to CT with RN on monitor.
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
## 2023-03-26 ENCOUNTER — Inpatient Hospital Stay (HOSPITAL_COMMUNITY): Payer: Commercial Managed Care - PPO

## 2023-03-26 LAB — BASIC METABOLIC PANEL
Anion gap: 8 (ref 5–15)
BUN: 9 mg/dL (ref 6–20)
CO2: 25 mmol/L (ref 22–32)
Calcium: 9 mg/dL (ref 8.9–10.3)
Chloride: 102 mmol/L (ref 98–111)
Creatinine, Ser: 0.92 mg/dL (ref 0.44–1.00)
GFR, Estimated: >60 mL/min (ref >60)
Glucose, Bld: 113 mg/dL — ABNORMAL HIGH (ref 70–99)
Potassium: 3.7 mmol/L (ref 3.5–5.1)
Sodium: 135 mmol/L (ref 135–145)

## 2023-03-26 LAB — CBC
HCT: 41.1 % (ref 36.0–46.0)
Hemoglobin: 13.9 g/dL (ref 12.0–15.0)
MCH: 30.3 pg (ref 26.0–34.0)
MCHC: 33.8 g/dL (ref 30.0–36.0)
MCV: 89.5 fL (ref 80.0–100.0)
Platelets: 252 10*3/uL (ref 150–400)
RBC: 4.59 MIL/uL (ref 3.87–5.11)
RDW: 11.9 % (ref 11.5–15.5)
WBC: 9.6 10*3/uL (ref 4.0–10.5)
nRBC: 0 % (ref 0.0–0.2)

## 2023-03-26 MED ORDER — LEVETIRACETAM 500 MG PO TABS
500.0000 mg | ORAL_TABLET | Freq: Two times a day (BID) | ORAL | Status: DC
  Administered 2023-03-26 – 2023-03-27 (×2): 500 mg via ORAL
  Filled 2023-03-26 (×2): qty 1
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
## 2023-03-27 LAB — BASIC METABOLIC PANEL
Anion gap: 7 (ref 5–15)
BUN: 7 mg/dL (ref 6–20)
CO2: 25 mmol/L (ref 22–32)
Calcium: 8.4 mg/dL — ABNORMAL LOW (ref 8.9–10.3)
Chloride: 101 mmol/L (ref 98–111)
Creatinine, Ser: 0.91 mg/dL (ref 0.44–1.00)
GFR, Estimated: >60 mL/min (ref >60)
Glucose, Bld: 94 mg/dL (ref 70–99)
Potassium: 3.5 mmol/L (ref 3.5–5.1)
Sodium: 133 mmol/L — ABNORMAL LOW (ref 135–145)

## 2023-03-27 LAB — CBC
HCT: 38.5 % (ref 36.0–46.0)
Hemoglobin: 13 g/dL (ref 12.0–15.0)
MCH: 29.5 pg (ref 26.0–34.0)
MCHC: 33.8 g/dL (ref 30.0–36.0)
MCV: 87.5 fL (ref 80.0–100.0)
Platelets: 251 10*3/uL (ref 150–400)
RBC: 4.4 MIL/uL (ref 3.87–5.11)
RDW: 11.9 % (ref 11.5–15.5)
WBC: 6.2 10*3/uL (ref 4.0–10.5)
nRBC: 0 % (ref 0.0–0.2)

## 2023-03-27 MED ORDER — LEVETIRACETAM 500 MG PO TABS: 500.0000 mg | ORAL_TABLET | Freq: Two times a day (BID) | ORAL | 0 refills | Status: AC

## 2023-03-27 MED ORDER — OXYCODONE HCL 5 MG PO TABS: 5.0000 mg | ORAL_TABLET | Freq: Four times a day (QID) | ORAL | 0 refills | Status: DC | PRN

## 2023-03-27 MED ORDER — METHOCARBAMOL 500 MG PO TABS: 500.0000 mg | ORAL_TABLET | Freq: Three times a day (TID) | ORAL | 0 refills | Status: DC | PRN

## 2023-03-27 MED ORDER — ACETAMINOPHEN 500 MG PO TABS: 1000.0000 mg | ORAL_TABLET | Freq: Four times a day (QID) | ORAL | Status: DC | PRN
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
## 2023-03-27 NOTE — Plan of Care (Signed)
  Problem: Nutrition: Goal: Adequate nutrition will be maintained Outcome: Adequate for Discharge   Problem: Safety: Goal: Ability to remain free from injury will improve Outcome: Progressing
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
## 2023-04-07 ENCOUNTER — Ambulatory Visit: Payer: Self-pay | Admitting: Family Medicine

## 2023-04-09 ENCOUNTER — Ambulatory Visit: Payer: Commercial Managed Care - PPO | Attending: Physician Assistant | Admitting: Physical Therapy

## 2023-04-09 ENCOUNTER — Encounter: Payer: Self-pay | Admitting: Occupational Therapy

## 2023-04-09 ENCOUNTER — Encounter: Payer: Commercial Managed Care - PPO | Admitting: Physical Therapy

## 2023-04-09 ENCOUNTER — Ambulatory Visit: Payer: Commercial Managed Care - PPO | Admitting: Occupational Therapy

## 2023-04-09 DIAGNOSIS — M6281 Muscle weakness (generalized): Secondary | ICD-10-CM

## 2023-04-09 DIAGNOSIS — R2689 Other abnormalities of gait and mobility: Secondary | ICD-10-CM | POA: Insufficient documentation

## 2023-04-09 DIAGNOSIS — S065XAA Traumatic subdural hemorrhage with loss of consciousness status unknown, initial encounter: Secondary | ICD-10-CM | POA: Diagnosis not present

## 2023-04-09 DIAGNOSIS — M5459 Other low back pain: Secondary | ICD-10-CM | POA: Insufficient documentation

## 2023-04-09 DIAGNOSIS — R41840 Attention and concentration deficit: Secondary | ICD-10-CM | POA: Insufficient documentation

## 2023-04-09 DIAGNOSIS — R42 Dizziness and giddiness: Secondary | ICD-10-CM | POA: Insufficient documentation
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
## 2023-04-09 NOTE — Addendum Note (Signed)
Addended by: Peter Congo on: 04/09/2023 12:40 PM   Modules accepted: Orders
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
## 2023-04-21 ENCOUNTER — Ambulatory Visit: Payer: Commercial Managed Care - PPO | Admitting: Occupational Therapy

## 2023-04-21 ENCOUNTER — Ambulatory Visit: Payer: Commercial Managed Care - PPO | Admitting: Physical Therapy

## 2023-04-25 DIAGNOSIS — I890 Lymphedema, not elsewhere classified: Secondary | ICD-10-CM | POA: Diagnosis not present

## 2023-04-28 ENCOUNTER — Other Ambulatory Visit: Payer: Self-pay | Admitting: Neurosurgery

## 2023-04-28 DIAGNOSIS — S065XAA Traumatic subdural hemorrhage with loss of consciousness status unknown, initial encounter: Secondary | ICD-10-CM

## 2023-05-01 ENCOUNTER — Ambulatory Visit: Payer: Commercial Managed Care - PPO | Admitting: Physical Therapy

## 2023-05-01 ENCOUNTER — Telehealth: Payer: Self-pay | Admitting: Occupational Therapy

## 2023-05-01 ENCOUNTER — Ambulatory Visit: Payer: Commercial Managed Care - PPO | Admitting: Occupational Therapy

## 2023-05-01 NOTE — Telephone Encounter (Signed)
Pt no-showed for PT/OT appointments today. Therefore, OT called pt's listed mobile phone number. OT left voicemail to pt reminding pt of next PT/OT appointments on 05/08/23. OT recommended to pt to call clinic office if pt is unable to make appointments. OT provided contact information of clinic.

## 2023-05-02 ENCOUNTER — Ambulatory Visit
Admission: RE | Admit: 2023-05-02 | Discharge: 2023-05-02 | Disposition: A | Discharge status: Home / Self Care | Payer: Commercial Managed Care - PPO | Source: Ambulatory Visit | Attending: Neurosurgery | Admitting: Neurosurgery

## 2023-05-02 DIAGNOSIS — I6200 Nontraumatic subdural hemorrhage, unspecified: Secondary | ICD-10-CM | POA: Diagnosis not present

## 2023-05-02 DIAGNOSIS — Z09 Encounter for follow-up examination after completed treatment for conditions other than malignant neoplasm: Secondary | ICD-10-CM | POA: Diagnosis not present

## 2023-05-02 DIAGNOSIS — S065XAA Traumatic subdural hemorrhage with loss of consciousness status unknown, initial encounter: Secondary | ICD-10-CM

## 2023-05-08 ENCOUNTER — Encounter: Payer: Self-pay | Admitting: Occupational Therapy

## 2023-05-08 ENCOUNTER — Encounter: Payer: Self-pay | Admitting: Physical Therapy

## 2023-05-08 ENCOUNTER — Ambulatory Visit: Payer: Commercial Managed Care - PPO | Admitting: Occupational Therapy

## 2023-05-08 ENCOUNTER — Ambulatory Visit: Payer: Commercial Managed Care - PPO | Attending: Physician Assistant | Admitting: Physical Therapy

## 2023-05-08 DIAGNOSIS — Z6827 Body mass index (BMI) 27.0-27.9, adult: Secondary | ICD-10-CM | POA: Diagnosis not present

## 2023-05-08 DIAGNOSIS — S065XAA Traumatic subdural hemorrhage with loss of consciousness status unknown, initial encounter: Secondary | ICD-10-CM | POA: Diagnosis not present

## 2023-05-08 NOTE — Therapy (Signed)
 PT spoke with pt who requests d/c at this time.

## 2023-05-08 NOTE — Therapy (Signed)
 PHYSICAL THERAPY DISCHARGE SUMMARY  Visits from Start of Care: 1  Current functional level related to goals / functional outcomes: Unable to assess, not returned since eval   Remaining deficits: Unable to assess, not returned since eval   Education / Equipment: Called patient and spoke with patient    Patient agrees to discharge. Patient goals were not met. Patient is being discharged due to not returning since the last visit.   Therapist called patient and informed of D/C. Patient verbalized understanding.  Lauraine Grumbling, PT, DPT

## 2023-05-13 ENCOUNTER — Encounter: Payer: Commercial Managed Care - PPO | Admitting: Occupational Therapy

## 2023-05-13 ENCOUNTER — Ambulatory Visit: Payer: Commercial Managed Care - PPO | Admitting: Physical Therapy

## 2023-05-14 DIAGNOSIS — Q820 Hereditary lymphedema: Secondary | ICD-10-CM | POA: Diagnosis not present

## 2023-05-20 ENCOUNTER — Encounter: Payer: Commercial Managed Care - PPO | Admitting: Occupational Therapy

## 2023-05-20 ENCOUNTER — Ambulatory Visit: Payer: Commercial Managed Care - PPO | Admitting: Physical Therapy

## 2023-05-27 ENCOUNTER — Ambulatory Visit: Payer: Commercial Managed Care - PPO | Admitting: Physical Therapy

## 2023-05-27 ENCOUNTER — Encounter: Payer: Commercial Managed Care - PPO | Admitting: Occupational Therapy

## 2023-05-29 DIAGNOSIS — I890 Lymphedema, not elsewhere classified: Secondary | ICD-10-CM | POA: Diagnosis not present

## 2023-06-04 DIAGNOSIS — Z01419 Encounter for gynecological examination (general) (routine) without abnormal findings: Secondary | ICD-10-CM | POA: Diagnosis not present

## 2023-06-04 DIAGNOSIS — Z124 Encounter for screening for malignant neoplasm of cervix: Secondary | ICD-10-CM | POA: Diagnosis not present

## 2023-06-04 DIAGNOSIS — Z113 Encounter for screening for infections with a predominantly sexual mode of transmission: Secondary | ICD-10-CM | POA: Diagnosis not present

## 2023-06-04 DIAGNOSIS — Z202 Contact with and (suspected) exposure to infections with a predominantly sexual mode of transmission: Secondary | ICD-10-CM | POA: Diagnosis not present

## 2023-06-04 DIAGNOSIS — Z30431 Encounter for routine checking of intrauterine contraceptive device: Secondary | ICD-10-CM | POA: Diagnosis not present

## 2023-06-06 DIAGNOSIS — Z113 Encounter for screening for infections with a predominantly sexual mode of transmission: Secondary | ICD-10-CM | POA: Diagnosis not present

## 2023-06-14 DIAGNOSIS — Q820 Hereditary lymphedema: Secondary | ICD-10-CM | POA: Diagnosis not present

## 2023-06-28 ENCOUNTER — Encounter: Payer: Self-pay | Admitting: *Deleted

## 2023-06-28 ENCOUNTER — Other Ambulatory Visit: Payer: Self-pay

## 2023-06-28 ENCOUNTER — Ambulatory Visit
Admission: EM | Admit: 2023-06-28 | Discharge: 2023-06-28 | Disposition: A | Discharge status: Home / Self Care | Payer: Commercial Managed Care - PPO | Attending: Family Medicine | Admitting: Family Medicine

## 2023-06-28 DIAGNOSIS — R110 Nausea: Secondary | ICD-10-CM

## 2023-06-28 DIAGNOSIS — J351 Hypertrophy of tonsils: Secondary | ICD-10-CM | POA: Diagnosis not present

## 2023-06-28 HISTORY — DX: Traumatic subdural hemorrhage with loss of consciousness status unknown, initial encounter: S06.5XAA

## 2023-06-28 LAB — POCT RAPID STREP A (OFFICE): Rapid Strep A Screen: NEGATIVE

## 2023-06-28 MED ORDER — PREDNISONE 20 MG PO TABS: ORAL_TABLET | ORAL | 0 refills | Status: DC

## 2023-06-28 MED ORDER — ONDANSETRON 8 MG PO TBDP: 8.0000 mg | ORAL_TABLET | Freq: Three times a day (TID) | ORAL | 0 refills | Status: DC | PRN

## 2023-06-28 NOTE — ED Provider Notes (Signed)
 [facility]    CSN: [id number] Arrival date & time: 06/28/23  1521      History   Chief Complaint Chief Complaint  Patient presents with   Swollen Tonsils    HPI Ashley Rush is a 38 y.o. female.   HPI  Past Medical History:  Diagnosis Date   Coronavirus infection    DVT (deep venous thrombosis) (HCC)    Gestational diabetes    Hypertension    Miscarriage    Subdural hematoma [facility])     Patient Active Problem List   Diagnosis Date Noted   SDH (subdural hematoma) (HCC) 03/25/2023   COVID-19 affecting pregnancy in third trimester 04/25/2019   Maternal care due to low transverse uterine scar from previous cesarean delivery 04/23/2019   Status post repeat low transverse cesarean section 04/23/2019   Abnormal human chorionic gonadotropin (hCG) 05/13/2017   Pregnancy of unknown anatomic location 05/13/2017   Edema 04/23/2013   Lymphedema 04/23/2013   Spotting between menses 12/21/2010   Acne 09/07/2010    Past Surgical History:  Procedure Laterality Date   CESAREAN SECTION  08/24/07   CESAREAN SECTION N/A 04/23/2019   Procedure: CESAREAN SECTION;  Surgeon: Sherian Rein, MD;  Location: [facility] LD [facility]S;  Service: Obstetrics;  Laterality: N/A;    OB History     Gravida  5   Para  2   Term  2   Preterm      AB  3   Living  2      SAB  1   IAB  2   Ectopic      Multiple  0   Live Births  2            Home Medications    Prior to Admission medications   Medication Sig Start Date End Date Taking? Authorizing Provider  ondansetron (ZOFRAN-ODT) 8 MG disintegrating tablet Take 1 tablet (8 mg total) by mouth every 8 (eight) hours as needed for nausea or vomiting. 06/28/23  Yes Trevor Iha, FNP  predniSONE (DELTASONE) 20 MG tablet Take 3 tabs PO daily x 5 days. 06/28/23  Yes Trevor Iha, FNP  acetaminophen (TYLENOL) 500 MG tablet Take 2 tablets (1,000 mg total) by mouth every 6 (six) hours as needed for mild pain (pain  score 1-3) or headache. 03/27/23   Juliet Rude, PA-C  levETIRAcetam (KEPPRA) 500 MG tablet Take 1 tablet (500 mg total) by mouth 2 (two) times daily for 5 days. 03/27/23 04/01/23  Juliet Rude, PA-C  methocarbamol (ROBAXIN) 500 MG tablet Take 1 tablet (500 mg total) by mouth every 8 (eight) hours as needed for muscle spasms. 03/27/23   Juliet Rude, PA-C  oxyCODONE (OXY IR/ROXICODONE) 5 MG immediate release tablet Take 1-2 tablets (5-10 mg total) by mouth every 6 (six) hours as needed for moderate pain (pain score 4-6) or severe pain (pain score 7-10). 03/27/23   Juliet Rude, PA-C    Family History Family History  Problem Relation Age of Onset   Diabetes Mother    Heart disease Mother    Immunodeficiency Mother        HIV- deceased at age 97   Hypertension Mother    Diabetes Father    Heart disease Father    Immunodeficiency Father        HIV- died last year at 61   Hypertension Father    Varicose Veins Father    Heart attack Father     Social History  Social History   Tobacco Use   Smoking status: Never   Smokeless tobacco: Never  Vaping Use   Vaping status: Never Used  Substance Use Topics   Alcohol use: Yes    Alcohol/week: 3.0 standard drinks of alcohol    Types: 3 Shots of liquor per week   Drug use: No     Allergies   Morphine and codeine and Shellfish allergy   Review of Systems Review of Systems  HENT:         Tonsillar swelling since this morning     Physical Exam Triage Vital Signs ED Triage Vitals  Encounter Vitals Group     BP      Systolic BP Percentile      Diastolic BP Percentile      Pulse      Resp      Temp      Temp src      SpO2      Weight      Height      Head Circumference      Peak Flow      Pain Score      Pain Loc      Pain Education      Exclude from Growth Chart    No data found.  Updated Vital Signs BP (!) 161/114   Pulse (!) 102   Temp 98.1 F (36.7 C) (Oral)   Resp 18   SpO2 97%        Physical Exam Vitals and nursing note reviewed.  Constitutional:      General: She is not in acute distress.    Appearance: Normal appearance. She is normal weight. She is not ill-appearing, toxic-appearing or diaphoretic.  HENT:     Head: Normocephalic and atraumatic.     Right Ear: Tympanic membrane, ear canal and external ear normal.     Left Ear: Tympanic membrane, ear canal and external ear normal.     Mouth/Throat:     Mouth: Mucous membranes are moist.     Pharynx: Oropharynx is clear. Uvula midline. No pharyngeal swelling, oropharyngeal exudate, posterior oropharyngeal erythema, uvula swelling or postnasal drip.     Tonsils: No tonsillar exudate or tonsillar abscesses. 1+ on the right. 1+ on the left.  Eyes:     Extraocular Movements: Extraocular movements intact.     Conjunctiva/sclera: Conjunctivae normal.     Pupils: Pupils are equal, round, and reactive to light.  Cardiovascular:     Rate and Rhythm: Normal rate and regular rhythm.     Pulses: Normal pulses.     Heart sounds: Normal heart sounds. No murmur heard. Pulmonary:     Effort: Pulmonary effort is normal.     Breath sounds: Normal breath sounds. No wheezing, rhonchi or rales.  Musculoskeletal:        General: Normal range of motion.     Cervical back: Normal range of motion and neck supple.  Skin:    General: Skin is warm and dry.  Neurological:     General: No focal deficit present.     Mental Status: She is alert and oriented to person, place, and time.      UC Treatments / Results  Labs (all labs ordered are listed, but only abnormal results are displayed) Labs Reviewed  POCT RAPID STREP A (OFFICE)    EKG   Radiology No results found.  Procedures Procedures (including critical care time)  Medications Ordered in UC Medications - No data  to display  Initial Impression / Assessment and Plan / UC Course  I have reviewed the triage vital signs and the nursing notes.  Pertinent labs &  imaging results that were available during my care of the patient were reviewed by me and considered in my medical decision making (see chart for details).     MDM: 1.  Tonsillar hypertrophy-Rx'd prednisone 20 mg tablet: Take 3 tabs p.o. daily x 5 days; 2.  Nausea-Rx'd Zofran 8 mg disintegrating tablet: Take 1 tablet every 8 hours, as needed for nausea. Ad patient discharged home, hemodynamically stable.  Vised patient to take medication as directed with food to completion.  Advised may take Zofran daily or as needed for nausea.  Encouraged to increase daily water intake to 64 ounces per day while taking these medications.  Advised if symptoms worsen and/or unresolved please follow-up with PCP or here for further evaluation.  Final Clinical Impressions(s) / UC Diagnoses   Final diagnoses:  Tonsillar hypertrophy  Nausea     Discharge Instructions      Advised patient to take medication as directed with food to completion.  Advised may take Zofran daily or as needed for nausea.  Encouraged to increase daily water intake to 64 ounces per day while taking these medications.  Advised if symptoms worsen and/or unresolved please follow-up with PCP or here for further evaluation.     ED Prescriptions     Medication Sig Dispense Auth. Provider   ondansetron (ZOFRAN-ODT) 8 MG disintegrating tablet Take 1 tablet (8 mg total) by mouth every 8 (eight) hours as needed for nausea or vomiting. 24 tablet Trevor Iha, FNP   predniSONE (DELTASONE) 20 MG tablet Take 3 tabs PO daily x 5 days. 15 tablet Trevor Iha, FNP      PDMP not reviewed this encounter.   Trevor Iha, FNP 06/28/23 1554

## 2023-06-28 NOTE — ED Triage Notes (Addendum)
 States woke with swollen tonsils this AM. Having some nausea and had 1 episode vomiting upon arrival to [facility]. Denies any throat pain. States also noticed the tip of her tongue feels numb since waking. Unknown if fevers.

## 2023-06-28 NOTE — Discharge Instructions (Addendum)
 Advised patient to take medication as directed with food to completion.  Advised may take Zofran daily or as needed for nausea.  Encouraged to increase daily water intake to 64 ounces per day while taking these medications.  Advised if symptoms worsen and/or unresolved please follow-up with PCP or here for further evaluation.

## 2023-06-29 ENCOUNTER — Ambulatory Visit
Admission: EM | Admit: 2023-06-29 | Discharge: 2023-06-29 | Disposition: A | Discharge status: Home / Self Care | Payer: Commercial Managed Care - PPO | Attending: Family Medicine | Admitting: Family Medicine

## 2023-06-29 DIAGNOSIS — J039 Acute tonsillitis, unspecified: Secondary | ICD-10-CM

## 2023-06-29 MED ORDER — AMOXICILLIN-POT CLAVULANATE 875-125 MG PO TABS: 1.0000 | ORAL_TABLET | Freq: Two times a day (BID) | ORAL | 0 refills | Status: DC

## 2023-06-29 NOTE — Discharge Instructions (Addendum)
 Advised patient take medication as directed with food to completion.  Encouraged increase daily water intake to 64 ounces per day while taking this medication.  Advised if symptoms worsen please follow-up with your PCP, ENT or here  for further evaluation.

## 2023-06-29 NOTE — ED Triage Notes (Signed)
 Pt c/o sore throat since yesterday morning. Was seen yesterday for swelling in tonsils. Was given prednisone and nausea medicine. Says provider said to return if sxs worsen which occurred at 10p last night. Ibuprofen prn.

## 2023-06-29 NOTE — ED Provider Notes (Signed)
 [facility]    CSN: [id number] Arrival date & time: 06/29/23  1313      History   Chief Complaint Chief Complaint  Patient presents with   Sore Throat    HPI Ashley Rush is a 38 y.o. female.   HPI  Past Medical History:  Diagnosis Date   Coronavirus infection    DVT (deep venous thrombosis) (HCC)    Gestational diabetes    Hypertension    Miscarriage    Subdural hematoma [facility])     Patient Active Problem List   Diagnosis Date Noted   SDH (subdural hematoma) (HCC) 03/25/2023   COVID-19 affecting pregnancy in third trimester 04/25/2019   Maternal care due to low transverse uterine scar from previous cesarean delivery 04/23/2019   Status post repeat low transverse cesarean section 04/23/2019   Abnormal human chorionic gonadotropin (hCG) 05/13/2017   Pregnancy of unknown anatomic location 05/13/2017   Edema 04/23/2013   Lymphedema 04/23/2013   Spotting between menses 12/21/2010   Acne 09/07/2010    Past Surgical History:  Procedure Laterality Date   CESAREAN SECTION  08/24/07   CESAREAN SECTION N/A 04/23/2019   Procedure: CESAREAN SECTION;  Surgeon: Sherian Rein, MD;  Location: [facility] LD ORS;  Service: Obstetrics;  Laterality: N/A;    OB History     Gravida  5   Para  2   Term  2   Preterm      AB  3   Living  2      SAB  1   IAB  2   Ectopic      Multiple  0   Live Births  2            Home Medications    Prior to Admission medications   Medication Sig Start Date End Date Taking? Authorizing Provider  amoxicillin-clavulanate (AUGMENTIN) 875-125 MG tablet Take 1 tablet by mouth every 12 (twelve) hours. 06/29/23  Yes Trevor Iha, FNP  acetaminophen (TYLENOL) 500 MG tablet Take 2 tablets (1,000 mg total) by mouth every 6 (six) hours as needed for mild pain (pain score 1-3) or headache. 03/27/23   Juliet Rude, PA-C  levETIRAcetam (KEPPRA) 500 MG tablet Take 1 tablet (500 mg total) by mouth 2 (two) times  daily for 5 days. 03/27/23 04/01/23  Juliet Rude, PA-C  methocarbamol (ROBAXIN) 500 MG tablet Take 1 tablet (500 mg total) by mouth every 8 (eight) hours as needed for muscle spasms. 03/27/23   Juliet Rude, PA-C  ondansetron (ZOFRAN-ODT) 8 MG disintegrating tablet Take 1 tablet (8 mg total) by mouth every 8 (eight) hours as needed for nausea or vomiting. 06/28/23   Trevor Iha, FNP  oxyCODONE (OXY IR/ROXICODONE) 5 MG immediate release tablet Take 1-2 tablets (5-10 mg total) by mouth every 6 (six) hours as needed for moderate pain (pain score 4-6) or severe pain (pain score 7-10). 03/27/23   Juliet Rude, PA-C  predniSONE (DELTASONE) 20 MG tablet Take 3 tabs PO daily x 5 days. 06/28/23   Trevor Iha, FNP    Family History Family History  Problem Relation Age of Onset   Diabetes Mother    Heart disease Mother    Immunodeficiency Mother        HIV- deceased at age 38   Hypertension Mother    Diabetes Father    Heart disease Father    Immunodeficiency Father        HIV- died last year at 5  Hypertension Father    Varicose Veins Father    Heart attack Father     Social History Social History   Tobacco Use   Smoking status: Never   Smokeless tobacco: Never  Vaping Use   Vaping status: Never Used  Substance Use Topics   Alcohol use: Yes    Alcohol/week: 3.0 standard drinks of alcohol    Types: 3 Shots of liquor per week   Drug use: No     Allergies   Morphine and codeine and Shellfish allergy   Review of Systems Review of Systems   Physical Exam Triage Vital Signs ED Triage Vitals  Encounter Vitals Group     BP 06/29/23 1437 (!) 197/125     Systolic BP Percentile --      Diastolic BP Percentile --      Pulse Rate 06/29/23 1437 98     Resp 06/29/23 1437 17     Temp 06/29/23 1437 97.9 F (36.6 C)     Temp src --      SpO2 06/29/23 1437 98 %     Weight --      Height --      Head Circumference --      Peak Flow --      Pain Score 06/29/23  1435 6     Pain Loc --      Pain Education --      Exclude from Growth Chart --    No data found.  Updated Vital Signs BP (!) 197/125 (BP Location: Left Arm)   Pulse 98   Temp 97.9 F (36.6 C)   Resp 17   SpO2 98%    Physical Exam Vitals and nursing note reviewed.  Constitutional:      Appearance: She is well-developed and normal weight.  HENT:     Head: Normocephalic and atraumatic.     Right Ear: Tympanic membrane and ear canal normal.     Left Ear: Tympanic membrane and ear canal normal.     Mouth/Throat:     Mouth: Mucous membranes are moist.     Pharynx: Oropharynx is clear. Uvula midline.     Tonsils: Tonsillar exudate present. 4+ on the right. 4+ on the left.  Cardiovascular:     Rate and Rhythm: Normal rate and regular rhythm.     Heart sounds: Normal heart sounds.  Pulmonary:     Effort: Pulmonary effort is normal.     Breath sounds: Normal breath sounds. No wheezing, rhonchi or rales.  Musculoskeletal:     Cervical back: Normal range of motion and neck supple.  Skin:    General: Skin is warm and dry.  Neurological:     General: No focal deficit present.     Mental Status: She is alert and oriented to person, place, and time.  Psychiatric:        Mood and Affect: Mood normal.        Behavior: Behavior normal.      UC Treatments / Results  Labs (all labs ordered are listed, but only abnormal results are displayed) Labs Reviewed - No data to display  EKG   Radiology No results found.  Procedures Procedures (including critical care time)  Medications Ordered in UC Medications - No data to display  Initial Impression / Assessment and Plan / UC Course  I have reviewed the triage vital signs and the nursing notes.  Pertinent labs & imaging results that were available during my care of the  patient were reviewed by me and considered in my medical decision making (see chart for details).     MDM: 1.  Acute tonsillitis, unspecified etiology-Rx'd  Augmentin 875/125 mg tablet: Take 1 tablet twice daily x 7 days. Advised patient take medication as directed with food to completion.  Encouraged increase daily water intake to 64 ounces per day while taking this medication.  Advised if symptoms worsen please follow-up with your PCP, ENT or here  for further evaluation.  Patient discharged home, hemodynamically stable. Final Clinical Impressions(s) / UC Diagnoses   Final diagnoses:  Acute tonsillitis, unspecified etiology     Discharge Instructions      Advised patient take medication as directed with food to completion.  Encouraged increase daily water intake to 64 ounces per day while taking this medication.  Advised if symptoms worsen please follow-up with your PCP, ENT or here  for further evaluation.    ED Prescriptions     Medication Sig Dispense Auth. Provider   amoxicillin-clavulanate (AUGMENTIN) 875-125 MG tablet Take 1 tablet by mouth every 12 (twelve) hours. 14 tablet Trevor Iha, FNP      PDMP not reviewed this encounter.   Trevor Iha, FNP 06/29/23 [phone]

## 2023-07-03 ENCOUNTER — Encounter: Payer: Self-pay | Admitting: Urgent Care

## 2023-07-03 ENCOUNTER — Ambulatory Visit (INDEPENDENT_AMBULATORY_CARE_PROVIDER_SITE_OTHER): Payer: Commercial Managed Care - PPO | Admitting: Urgent Care

## 2023-07-03 VITALS — BP 181/107 | HR 82 | Ht 68.0 in | Wt 182.8 lb

## 2023-07-03 DIAGNOSIS — I890 Lymphedema, not elsewhere classified: Secondary | ICD-10-CM | POA: Diagnosis not present

## 2023-07-03 DIAGNOSIS — S065XAA Traumatic subdural hemorrhage with loss of consciousness status unknown, initial encounter: Secondary | ICD-10-CM

## 2023-07-03 DIAGNOSIS — Z8632 Personal history of gestational diabetes: Secondary | ICD-10-CM | POA: Diagnosis not present

## 2023-07-03 DIAGNOSIS — I10 Essential (primary) hypertension: Secondary | ICD-10-CM

## 2023-07-03 DIAGNOSIS — Z86718 Personal history of other venous thrombosis and embolism: Secondary | ICD-10-CM

## 2023-07-03 LAB — POCT URINALYSIS DIPSTICK
Bilirubin, UA: NEGATIVE
Blood, UA: NEGATIVE
Glucose, UA: NEGATIVE
Ketones, UA: NEGATIVE
Leukocytes, UA: NEGATIVE
Nitrite, UA: NEGATIVE
Protein, UA: NEGATIVE
Spec Grav, UA: 1.015 (ref 1.010–1.025)
Urobilinogen, UA: 0.2 U/dL
pH, UA: 6 (ref 5.0–8.0)

## 2023-07-03 MED ORDER — CHLORTHALIDONE 25 MG PO TABS: 25.0000 mg | ORAL_TABLET | Freq: Every day | ORAL | 2 refills | Status: AC
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
## 2023-07-03 NOTE — Patient Instructions (Signed)
 We have drawn labs to assess for any possible causes of your elevated blood pressure. Please start taking chlorthalidone 25mg  once daily, preferably in the morning. This medication will likely make you pee a lot. This is normal It should also help with your lymphedema.  Please purchase a blood pressure cuff and keep track of it at home. Goal BP readings are 120/80. Please write down your readings and bring to your next office visit in 2 weeks.  Cut back on salt!! Do NOT add any table salt to your food. Cook with salt-free seasonings. Read the back of food labels and try not to purchase any prepared foods with >5% daily value for sodium per serving. Please read the attached handouts regarding blood pressure.

## 2023-07-04 ENCOUNTER — Encounter: Payer: Self-pay | Admitting: Urgent Care

## 2023-07-04 LAB — CBC WITH DIFFERENTIAL/PLATELET
Basophils Absolute: 0 10*3/uL (ref 0.0–0.1)
Basophils Relative: 0.6 % (ref 0.0–3.0)
Eosinophils Absolute: 0.1 10*3/uL (ref 0.0–0.7)
Eosinophils Relative: 0.9 % (ref 0.0–5.0)
HCT: 41.8 % (ref 36.0–46.0)
Hemoglobin: 13.7 g/dL (ref 12.0–15.0)
Lymphocytes Relative: 33.3 % (ref 12.0–46.0)
Lymphs Abs: 2.3 10*3/uL (ref 0.7–4.0)
MCHC: 32.8 g/dL (ref 30.0–36.0)
MCV: 91.8 fL (ref 78.0–100.0)
Monocytes Absolute: 0.4 10*3/uL (ref 0.1–1.0)
Monocytes Relative: 5.5 % (ref 3.0–12.0)
Neutro Abs: 4.1 10*3/uL (ref 1.4–7.7)
Neutrophils Relative %: 59.7 % (ref 43.0–77.0)
Platelets: 247 10*3/uL (ref 150.0–400.0)
RBC: 4.56 Mil/uL (ref 3.87–5.11)
RDW: 12 % (ref 11.5–15.5)
WBC: 6.9 10*3/uL (ref 4.0–10.5)

## 2023-07-04 LAB — COMPREHENSIVE METABOLIC PANEL
ALT: 16 U/L (ref 0–35)
AST: 11 U/L (ref 0–37)
Albumin: 4.2 g/dL (ref 3.5–5.2)
Alkaline Phosphatase: 47 U/L (ref 39–117)
BUN: 13 mg/dL (ref 6–23)
CO2: 30 meq/L (ref 19–32)
Calcium: 9 mg/dL (ref 8.4–10.5)
Chloride: 101 meq/L (ref 96–112)
Creatinine, Ser: 0.89 mg/dL (ref 0.40–1.20)
GFR: 81.97 mL/min (ref >60.00)
Glucose, Bld: 91 mg/dL (ref 70–99)
Potassium: 3.5 meq/L (ref 3.5–5.1)
Sodium: 138 meq/L (ref 135–145)
Total Bilirubin: 1 mg/dL (ref 0.2–1.2)
Total Protein: 7.1 g/dL (ref 6.0–8.3)

## 2023-07-04 LAB — LIPID PANEL
Cholesterol: 152 mg/dL (ref 0–200)
HDL: 59.5 mg/dL (ref >39.00)
LDL Cholesterol: 80 mg/dL (ref 0–99)
NonHDL: 92.45
Total CHOL/HDL Ratio: 3
Triglycerides: 63 mg/dL (ref 0.0–149.0)
VLDL: 12.6 mg/dL (ref 0.0–40.0)

## 2023-07-04 LAB — TSH: TSH: 1.21 u[IU]/mL (ref 0.35–5.50)

## 2023-07-04 LAB — CORTISOL: Cortisol, Plasma: 4.9 ug/dL

## 2023-07-04 LAB — HEMOGLOBIN A1C: Hgb A1c MFr Bld: 5.6 % (ref 4.6–6.5)

## 2023-07-09 LAB — ALDOSTERONE + RENIN ACTIVITY W/ RATIO
ALDO / PRA Ratio: 7.3 ratio (ref 0.9–28.9)
Aldosterone: 6 ng/dL
Renin Activity: 0.82 ng/mL/h (ref 0.25–5.82)

## 2023-07-12 DIAGNOSIS — Q820 Hereditary lymphedema: Secondary | ICD-10-CM | POA: Diagnosis not present

## 2023-07-17 ENCOUNTER — Ambulatory Visit: Payer: Commercial Managed Care - PPO | Admitting: Urgent Care

## 2023-07-24 ENCOUNTER — Ambulatory Visit: Admitting: Urgent Care

## 2023-07-28 ENCOUNTER — Ambulatory Visit: Admitting: Urgent Care

## 2023-08-11 ENCOUNTER — Encounter: Payer: Self-pay | Admitting: Urgent Care

## 2023-08-12 DIAGNOSIS — Q820 Hereditary lymphedema: Secondary | ICD-10-CM | POA: Diagnosis not present

## 2023-09-11 DIAGNOSIS — Q820 Hereditary lymphedema: Secondary | ICD-10-CM | POA: Diagnosis not present

## 2023-10-12 DIAGNOSIS — Q820 Hereditary lymphedema: Secondary | ICD-10-CM | POA: Diagnosis not present

## 2023-11-11 DIAGNOSIS — Q820 Hereditary lymphedema: Secondary | ICD-10-CM | POA: Diagnosis not present

## 2023-12-12 DIAGNOSIS — Q820 Hereditary lymphedema: Secondary | ICD-10-CM | POA: Diagnosis not present

## 2024-01-12 DIAGNOSIS — Q820 Hereditary lymphedema: Secondary | ICD-10-CM | POA: Diagnosis not present

## 2024-02-11 DIAGNOSIS — Q820 Hereditary lymphedema: Secondary | ICD-10-CM | POA: Diagnosis not present

## 2024-02-20 ENCOUNTER — Other Ambulatory Visit (HOSPITAL_BASED_OUTPATIENT_CLINIC_OR_DEPARTMENT_OTHER): Payer: Self-pay

## 2024-03-08 DIAGNOSIS — B3731 Acute candidiasis of vulva and vagina: Secondary | ICD-10-CM | POA: Diagnosis not present

## 2024-03-08 DIAGNOSIS — N760 Acute vaginitis: Secondary | ICD-10-CM | POA: Diagnosis not present

## 2024-03-08 DIAGNOSIS — N898 Other specified noninflammatory disorders of vagina: Secondary | ICD-10-CM | POA: Diagnosis not present

## 2024-03-08 DIAGNOSIS — B9689 Other specified bacterial agents as the cause of diseases classified elsewhere: Secondary | ICD-10-CM | POA: Diagnosis not present
